# Patient Record
Sex: Male | Born: 1970 | Race: Black or African American | Hispanic: No | Marital: Married | Smoking: Never smoker
Health system: Southern US, Community
[De-identification: ages and names within clinical notes are randomized; demographics above are authoritative.]

## PROBLEM LIST (undated history)

## (undated) DIAGNOSIS — F524 Premature ejaculation: Secondary | ICD-10-CM

## (undated) DIAGNOSIS — K219 Gastro-esophageal reflux disease without esophagitis: Secondary | ICD-10-CM

## (undated) DIAGNOSIS — K638219 Small intestinal bacterial overgrowth, unspecified: Secondary | ICD-10-CM

## (undated) DIAGNOSIS — G4730 Sleep apnea, unspecified: Secondary | ICD-10-CM

## (undated) DIAGNOSIS — F32A Depression, unspecified: Secondary | ICD-10-CM

## (undated) DIAGNOSIS — E669 Obesity, unspecified: Secondary | ICD-10-CM

## (undated) DIAGNOSIS — J309 Allergic rhinitis, unspecified: Secondary | ICD-10-CM

## (undated) DIAGNOSIS — E785 Hyperlipidemia, unspecified: Secondary | ICD-10-CM

## (undated) DIAGNOSIS — K5900 Constipation, unspecified: Secondary | ICD-10-CM

## (undated) DIAGNOSIS — R0600 Dyspnea, unspecified: Secondary | ICD-10-CM

## (undated) DIAGNOSIS — I10 Essential (primary) hypertension: Secondary | ICD-10-CM

## (undated) DIAGNOSIS — F419 Anxiety disorder, unspecified: Secondary | ICD-10-CM

## (undated) DIAGNOSIS — G47419 Narcolepsy without cataplexy: Secondary | ICD-10-CM

## (undated) HISTORY — DX: Essential (primary) hypertension: I10

## (undated) HISTORY — PX: LACERATION REPAIR: SHX5168

---

## 2011-11-15 ENCOUNTER — Telehealth: Payer: Self-pay | Admitting: Internal Medicine

## 2011-11-15 NOTE — Telephone Encounter (Signed)
We can move him to next available 30 or opening.

## 2011-11-15 NOTE — Telephone Encounter (Signed)
[phone] Pt has new patient appointment 4/26 and would like to be seen sooner if possible. He is a diabetic and his blood sugar running now its 378 and he is on metform Just moved to area and no primary care

## 2011-11-16 NOTE — Telephone Encounter (Signed)
I spoke w/pt at 4:45 yesterday and advised him to go to the urgent care at that time, he agreed. Also advised him that we would put him on the wait list which he understood.

## 2011-12-08 ENCOUNTER — Ambulatory Visit (INDEPENDENT_AMBULATORY_CARE_PROVIDER_SITE_OTHER): Payer: BC Managed Care – PPO | Admitting: Internal Medicine

## 2011-12-08 ENCOUNTER — Ambulatory Visit (INDEPENDENT_AMBULATORY_CARE_PROVIDER_SITE_OTHER)
Admission: RE | Admit: 2011-12-08 | Discharge: 2011-12-08 | Disposition: A | Discharge status: Home / Self Care | Payer: BC Managed Care – PPO | Source: Ambulatory Visit | Attending: Internal Medicine | Admitting: Internal Medicine

## 2011-12-08 ENCOUNTER — Encounter: Payer: Self-pay | Admitting: Internal Medicine

## 2011-12-08 DIAGNOSIS — K5900 Constipation, unspecified: Secondary | ICD-10-CM

## 2011-12-08 DIAGNOSIS — E1165 Type 2 diabetes mellitus with hyperglycemia: Secondary | ICD-10-CM | POA: Insufficient documentation

## 2011-12-08 DIAGNOSIS — E119 Type 2 diabetes mellitus without complications: Secondary | ICD-10-CM

## 2011-12-08 DIAGNOSIS — IMO0001 Reserved for inherently not codable concepts without codable children: Secondary | ICD-10-CM | POA: Insufficient documentation

## 2011-12-08 DIAGNOSIS — Z309 Encounter for contraceptive management, unspecified: Secondary | ICD-10-CM

## 2011-12-08 DIAGNOSIS — Z23 Encounter for immunization: Secondary | ICD-10-CM

## 2011-12-08 MED ORDER — METFORMIN HCL 1000 MG PO TABS: 1000.0000 mg | ORAL_TABLET | Freq: Two times a day (BID) | ORAL | Status: DC

## 2011-12-08 MED ORDER — LUBIPROSTONE 24 MCG PO CAPS: 24.0000 ug | ORAL_CAPSULE | Freq: Two times a day (BID) | ORAL | Status: DC

## 2011-12-08 MED ORDER — GLIPIZIDE ER 2.5 MG PO TB24: 2.5000 mg | ORAL_TABLET | Freq: Every day | ORAL | Status: DC

## 2011-12-08 NOTE — Progress Notes (Signed)
Subjective:    Patient ID: Timothy Friedman, male    DOB: 21-Apr-1971, 41 y.o.   MRN: [id number]  HPI 41 year old male with history of diabetes mellitus presents to establish care. He recently moved to [facility] from [place]. He reports that he moved secondary to a new job. In regards to his diabetes, he reports good control in the past with the use of Actos and metformin. However, his previous physician took him off Actos because of concerns about side effects from this medication. He has been taking metformin 2000 mg twice daily. His sugars have been poorly controlled, and he has had blood sugars over 400. He reports that most recent blood sugar this morning was 286. He reports some dietary indiscretion but has mostly been trying to limit intake of processed carbohydrates.  He is also concerned today about chronic constipation and abdominal distention. He reports pain and distention, particularly in his left lower quadrant and is concerned about bowel obstruction. When he has a bowel movement it is typically small pebbles or liquid. He has tried over-the-counter medications to help with stools with no improvement. He denies any blood in his stools or black stools. He denies any nausea or vomiting. He has never had abdominal surgery.  Outpatient Encounter Prescriptions as of 12/08/2011  Medication Sig Dispense Refill  . metFORMIN (GLUCOPHAGE) 1000 MG tablet Take 1 tablet (1,000 mg total) by mouth 2 (two) times daily with a meal.  60 tablet  3  . glipiZIDE (GLUCOTROL XL) 2.5 MG 24 hr tablet Take 1 tablet (2.5 mg total) by mouth daily.  30 tablet  3  . lubiprostone (AMITIZA) 24 MCG capsule Take 1 capsule (24 mcg total) by mouth 2 (two) times daily with a meal.  30 capsule  0   BP 138/88  Pulse 109  Temp(Src) 98.4 F (36.9 C) (Oral)  Ht 5\' 6"  (1.676 m)  Wt 214 lb (97.07 kg)  BMI 34.54 kg/m2  SpO2 96%  Review of Systems  Constitutional: Negative for fever, chills, activity  change, appetite change, fatigue and unexpected weight change.  Eyes: Negative for visual disturbance.  Respiratory: Negative for cough and shortness of breath.   Cardiovascular: Negative for chest pain, palpitations and leg swelling.  Gastrointestinal: Positive for abdominal pain, constipation and abdominal distention.  Genitourinary: Negative for dysuria, urgency and difficulty urinating.  Musculoskeletal: Negative for arthralgias and gait problem.  Skin: Negative for color change and rash.  Hematological: Negative for adenopathy.  Psychiatric/Behavioral: Negative for sleep disturbance and dysphoric mood. The patient is not nervous/anxious.        Objective:   Physical Exam  Constitutional: He is oriented to person, place, and time. He appears well-developed and well-nourished. No distress.  HENT:  Head: Normocephalic and atraumatic.  Right Ear: External ear normal.  Left Ear: External ear normal.  Nose: Nose normal.  Mouth/Throat: Oropharynx is clear and moist. No oropharyngeal exudate.  Eyes: Conjunctivae and EOM are normal. Pupils are equal, round, and reactive to light. Right eye exhibits no discharge. Left eye exhibits no discharge. No scleral icterus.  Neck: Normal range of motion. Neck supple. No tracheal deviation present. No thyromegaly present.  Cardiovascular: Normal rate, regular rhythm and normal heart sounds.  Exam reveals no gallop and no friction rub.   No murmur heard. Pulmonary/Chest: Effort normal and breath sounds normal. No respiratory distress. He has no wheezes. He has no rales. He exhibits no tenderness.  Abdominal: Soft. Bowel sounds are normal. He exhibits distension. He  exhibits no mass. There is no tenderness. There is no guarding.  Musculoskeletal: Normal range of motion. He exhibits no edema.  Lymphadenopathy:    He has no cervical adenopathy.  Neurological: He is alert and oriented to person, place, and time. No cranial nerve deficit. Coordination  normal.  Skin: Skin is warm and dry. No rash noted. He is not diaphoretic. No erythema. No pallor.  Psychiatric: He has a normal mood and affect. His behavior is normal. Judgment and thought content normal.          Assessment & Plan:

## 2011-12-08 NOTE — Assessment & Plan Note (Signed)
Question if he may have bowel obstruction based on description of symptoms. Will get plain xray. Will start Amitiza.  If no improvement, would favor getting CT abdomen for further evaluation and GI referral for endoscopy. He will email with update. Follow up 2 weeks.

## 2011-12-08 NOTE — Assessment & Plan Note (Addendum)
Blood sugars recently have been poorly controlled. Will start glipizide. Will reduce dose of metformin to 1000 mg twice daily. Patient will check sugars twice daily and will e-mail periodically with results. Will check A1c with labs today. We discussed starting ACEi and statin, but will hold off until next visit. Followup in 2 weeks.

## 2011-12-08 NOTE — Assessment & Plan Note (Signed)
Pt would like vasectomy. Will set up referral to urology.

## 2011-12-09 LAB — LIPID PANEL
HDL: 40 mg/dL (ref >39)
LDL Cholesterol: 114 mg/dL — ABNORMAL HIGH (ref 0–99)
Total CHOL/HDL Ratio: 5.1 Ratio

## 2011-12-09 LAB — COMPREHENSIVE METABOLIC PANEL
AST: 17 U/L (ref 0–37)
BUN: 15 mg/dL (ref 6–23)
Calcium: 9.3 mg/dL (ref 8.4–10.5)
Chloride: 99 mEq/L (ref 96–112)
Creat: 1.04 mg/dL (ref 0.50–1.35)

## 2011-12-09 LAB — TSH: TSH: 0.363 u[IU]/mL (ref 0.350–4.500)

## 2011-12-09 LAB — HEMOGLOBIN A1C: Hgb A1c MFr Bld: 12.3 % — ABNORMAL HIGH (ref <5.7)

## 2011-12-10 ENCOUNTER — Encounter: Payer: Self-pay | Admitting: Internal Medicine

## 2011-12-22 ENCOUNTER — Other Ambulatory Visit: Payer: Self-pay | Admitting: *Deleted

## 2011-12-22 MED ORDER — LUBIPROSTONE 24 MCG PO CAPS: 24.0000 ug | ORAL_CAPSULE | Freq: Two times a day (BID) | ORAL | Status: AC

## 2011-12-22 NOTE — Telephone Encounter (Signed)
Rx sent to [facility], patient was advised to check with the pharmacy if he didn't hear back from [place].

## 2011-12-22 NOTE — Telephone Encounter (Signed)
Patient is requesting a Rx for Amitiza.  He stated that Dr. Dan Humphreys gave him a Rx and samples and he can't find the Rx and would like it called to [facility].  Please advise.

## 2011-12-22 NOTE — Telephone Encounter (Signed)
Fine to call in Amitiza bid, disp 60 with 3 refill

## 2012-01-01 ENCOUNTER — Ambulatory Visit: Payer: BC Managed Care – PPO | Admitting: Internal Medicine

## 2012-01-02 ENCOUNTER — Ambulatory Visit (INDEPENDENT_AMBULATORY_CARE_PROVIDER_SITE_OTHER): Payer: BC Managed Care – PPO | Admitting: Internal Medicine

## 2012-01-02 ENCOUNTER — Encounter: Payer: Self-pay | Admitting: Internal Medicine

## 2012-01-02 DIAGNOSIS — E119 Type 2 diabetes mellitus without complications: Secondary | ICD-10-CM

## 2012-01-02 DIAGNOSIS — K5900 Constipation, unspecified: Secondary | ICD-10-CM

## 2012-01-02 DIAGNOSIS — I10 Essential (primary) hypertension: Secondary | ICD-10-CM

## 2012-01-02 DIAGNOSIS — E785 Hyperlipidemia, unspecified: Secondary | ICD-10-CM

## 2012-01-02 MED ORDER — GLIPIZIDE ER 5 MG PO TB24: 5.0000 mg | ORAL_TABLET | Freq: Every day | ORAL | Status: DC

## 2012-01-02 MED ORDER — ATORVASTATIN CALCIUM 20 MG PO TABS: 20.0000 mg | ORAL_TABLET | Freq: Every day | ORAL | Status: DC

## 2012-01-02 MED ORDER — LISINOPRIL 5 MG PO TABS: 5.0000 mg | ORAL_TABLET | Freq: Every day | ORAL | Status: DC

## 2012-01-02 NOTE — Assessment & Plan Note (Signed)
BP elevated. Will add Lisinopril. Follow up BMP 1 week. RTC 1 month.

## 2012-01-02 NOTE — Progress Notes (Signed)
Subjective:    Patient ID: Timothy Friedman, male    DOB: Mar 25, 1971, 41 y.o.   MRN: [id number]  HPI 41 year old male with history of diabetes presents for followup. At his last visit, he was started on glipizide. He reports that most of his blood sugars continue to be near 180-200 fasting in year 250 post prandial. He reports full compliance with this medication. He denies any noted side effects from the medicine. He is also concerned today about some increased sweating of his feet bilaterally. He reports that this occurs when he wears diabetic socks. He would like to establish care with a podiatrist to help with trimming of his nails.  In regards to his constipation, he reports that symptoms are much improved with the use of Amitiza.  He denies any loose stools or abdominal pain.  Outpatient Encounter Prescriptions as of 01/02/2012  Medication Sig Dispense Refill  . glipiZIDE (GLUCOTROL XL) 5 MG 24 hr tablet Take 1 tablet (5 mg total) by mouth daily.  30 tablet  3  . lubiprostone (AMITIZA) 24 MCG capsule Take 1 capsule (24 mcg total) by mouth 2 (two) times daily with a meal.  60 capsule  3  . metFORMIN (GLUCOPHAGE) 1000 MG tablet Take 1 tablet (1,000 mg total) by mouth 2 (two) times daily with a meal.  60 tablet  3  . DISCONTD: glipiZIDE (GLUCOTROL XL) 2.5 MG 24 hr tablet Take 1 tablet (2.5 mg total) by mouth daily.  30 tablet  3  . atorvastatin (LIPITOR) 20 MG tablet Take 1 tablet (20 mg total) by mouth daily.  30 tablet  3  . lisinopril (PRINIVIL,ZESTRIL) 5 MG tablet Take 1 tablet (5 mg total) by mouth daily.  30 tablet  3    Review of Systems  Constitutional: Negative for fever, chills, activity change, appetite change, fatigue and unexpected weight change.  Eyes: Negative for visual disturbance.  Respiratory: Negative for cough and shortness of breath.   Cardiovascular: Negative for chest pain, palpitations and leg swelling.  Gastrointestinal: Negative for abdominal pain and abdominal  distention.  Genitourinary: Negative for dysuria, urgency and difficulty urinating.  Musculoskeletal: Negative for arthralgias and gait problem.  Skin: Negative for color change and rash.  Hematological: Negative for adenopathy.  Psychiatric/Behavioral: Negative for sleep disturbance and dysphoric mood. The patient is not nervous/anxious.    BP 144/97  Pulse 73  Temp(Src) 97.9 F (36.6 C) (Oral)  Resp 16  Wt 220 lb 8 oz (100.018 kg)  SpO2 98%     Objective:   Physical Exam  Constitutional: He is oriented to person, place, and time. He appears well-developed and well-nourished. No distress.  HENT:  Head: Normocephalic and atraumatic.  Right Ear: External ear normal.  Left Ear: External ear normal.  Nose: Nose normal.  Mouth/Throat: Oropharynx is clear and moist. No oropharyngeal exudate.  Eyes: Conjunctivae and EOM are normal. Pupils are equal, round, and reactive to light. Right eye exhibits no discharge. Left eye exhibits no discharge. No scleral icterus.  Neck: Normal range of motion. Neck supple. No tracheal deviation present. No thyromegaly present.  Cardiovascular: Normal rate, regular rhythm and normal heart sounds.  Exam reveals no gallop and no friction rub.   No murmur heard. Pulmonary/Chest: Effort normal and breath sounds normal. No respiratory distress. He has no wheezes. He has no rales. He exhibits no tenderness.  Musculoskeletal: Normal range of motion. He exhibits no edema.  Lymphadenopathy:    He has no cervical adenopathy.  Neurological: He is  alert and oriented to person, place, and time. No cranial nerve deficit. Coordination normal.  Skin: Skin is warm and dry. No rash noted. He is not diaphoretic. No erythema. No pallor.  Psychiatric: He has a normal mood and affect. His behavior is normal. Judgment and thought content normal.          Assessment & Plan:

## 2012-01-02 NOTE — Assessment & Plan Note (Signed)
Blood sugars improved but still above goal.  Discussed likely need for insulin. Pt would prefer to stick with pills for now. Will increase glipizide to 5mg  daily.  Will continue metformin. Follow up 1 month.

## 2012-01-02 NOTE — Assessment & Plan Note (Signed)
LDL 114. Discussed goal of <70. Will start lipitor. Repeat lipids and LFTs 1 month.

## 2012-01-02 NOTE — Assessment & Plan Note (Signed)
Improved with amitiza. Will continue. 

## 2012-01-09 ENCOUNTER — Other Ambulatory Visit (INDEPENDENT_AMBULATORY_CARE_PROVIDER_SITE_OTHER): Payer: BC Managed Care – PPO | Admitting: *Deleted

## 2012-01-09 DIAGNOSIS — I10 Essential (primary) hypertension: Secondary | ICD-10-CM

## 2012-01-09 LAB — BASIC METABOLIC PANEL
BUN: 14 mg/dL (ref 6–23)
Creatinine, Ser: 0.9 mg/dL (ref 0.4–1.5)
GFR: 114.53 mL/min (ref >60.00)
Glucose, Bld: 252 mg/dL — ABNORMAL HIGH (ref 70–99)
Potassium: 4.8 mEq/L (ref 3.5–5.1)

## 2012-01-27 ENCOUNTER — Encounter: Payer: Self-pay | Admitting: Internal Medicine

## 2012-01-29 ENCOUNTER — Telehealth: Payer: Self-pay | Admitting: *Deleted

## 2012-01-29 NOTE — Telephone Encounter (Signed)
Opened in error

## 2012-03-11 ENCOUNTER — Encounter: Payer: Self-pay | Admitting: Internal Medicine

## 2012-04-05 ENCOUNTER — Other Ambulatory Visit: Payer: Self-pay | Admitting: Internal Medicine

## 2012-04-05 NOTE — Telephone Encounter (Signed)
Patient needing a refill on his metformin he only has one pill left.

## 2012-04-05 NOTE — Telephone Encounter (Signed)
Patient advised via message left on cell phone voicemail, Rx was sent to pharmacy this morning.  Advised patient of our 24-48 your turn around time for all Rx refills.

## 2012-04-10 ENCOUNTER — Other Ambulatory Visit: Payer: Self-pay | Admitting: Internal Medicine

## 2012-04-12 ENCOUNTER — Ambulatory Visit (INDEPENDENT_AMBULATORY_CARE_PROVIDER_SITE_OTHER): Payer: BC Managed Care – PPO | Admitting: Internal Medicine

## 2012-04-12 ENCOUNTER — Other Ambulatory Visit: Payer: Self-pay | Admitting: Internal Medicine

## 2012-04-12 ENCOUNTER — Encounter: Payer: Self-pay | Admitting: Internal Medicine

## 2012-04-12 VITALS — BP 120/88 | HR 75 | Temp 98.3°F | Ht 66.0 in | Wt 224.0 lb

## 2012-04-12 DIAGNOSIS — E1165 Type 2 diabetes mellitus with hyperglycemia: Secondary | ICD-10-CM

## 2012-04-12 DIAGNOSIS — I10 Essential (primary) hypertension: Secondary | ICD-10-CM

## 2012-04-12 DIAGNOSIS — E785 Hyperlipidemia, unspecified: Secondary | ICD-10-CM

## 2012-04-12 LAB — COMPREHENSIVE METABOLIC PANEL
ALT: 18 U/L (ref 0–53)
AST: 14 U/L (ref 0–37)
Albumin: 4.2 g/dL (ref 3.5–5.2)
Alkaline Phosphatase: 70 U/L (ref 39–117)
BUN: 17 mg/dL (ref 6–23)
CO2: 26 mEq/L (ref 19–32)
Calcium: 9 mg/dL (ref 8.4–10.5)
Chloride: 104 mEq/L (ref 96–112)
Creatinine, Ser: 1.1 mg/dL (ref 0.4–1.5)
GFR: 94.41 mL/min (ref >60.00)
Glucose, Bld: 161 mg/dL — ABNORMAL HIGH (ref 70–99)
Potassium: 4.2 mEq/L (ref 3.5–5.1)
Sodium: 137 mEq/L (ref 135–145)
Total Bilirubin: 0.5 mg/dL (ref 0.3–1.2)
Total Protein: 7.5 g/dL (ref 6.0–8.3)

## 2012-04-12 LAB — LIPID PANEL
Cholesterol: 159 mg/dL (ref 0–200)
HDL: 40.2 mg/dL (ref >39.00)
LDL Cholesterol: 104 mg/dL — ABNORMAL HIGH (ref 0–99)
Total CHOL/HDL Ratio: 4
Triglycerides: 73 mg/dL (ref 0.0–149.0)
VLDL: 14.6 mg/dL (ref 0.0–40.0)

## 2012-04-12 LAB — HEMOGLOBIN A1C: Hgb A1c MFr Bld: 9 % — ABNORMAL HIGH (ref 4.6–6.5)

## 2012-04-12 LAB — MICROALBUMIN / CREATININE URINE RATIO
Creatinine,U: 168.8 mg/dL
Microalb, Ur: 1.4 mg/dL (ref 0.0–1.9)

## 2012-04-12 MED ORDER — GLIPIZIDE ER 5 MG PO TB24: 5.0000 mg | ORAL_TABLET | Freq: Every day | ORAL | Status: DC

## 2012-04-12 MED ORDER — GLUCOSE BLOOD VI STRP: ORAL_STRIP | Status: DC

## 2012-04-12 NOTE — Progress Notes (Signed)
  Subjective:    Patient ID: Timothy Friedman, male    DOB: 02-Jun-1971, 41 y.o.   MRN: [id number]  HPI 41 year old male with history of diabetes, hyperlipidemia, hypertension presents for followup. He reports he has been doing very well he has made significant effort in improving his diet and has been taking medications regularly. He notes that blood sugars have been typically in the 110-120 range fasting. He denies any low sugar less than 60 or elevated sugars greater than 200. He denies any side effects from his medications. He denies any chest pain, palpitations, headache. He reports normal energy level.  Outpatient Encounter Prescriptions as of 04/12/2012  Medication Sig Dispense Refill  . glipiZIDE (GLUCOTROL XL) 5 MG 24 hr tablet Take 1 tablet (5 mg total) by mouth daily.  90 tablet  3  . metFORMIN (GLUCOPHAGE) 1000 MG tablet TAKE 1 TABLET BY MOUTH TWICE DAILY WITH A MEAL  60 tablet  3  . atorvastatin (LIPITOR) 20 MG tablet Take 1 tablet (20 mg total) by mouth daily.  30 tablet  3  . lisinopril (PRINIVIL,ZESTRIL) 5 MG tablet Take 1 tablet (5 mg total) by mouth daily.  30 tablet  3   BP 120/88  Pulse 75  Temp 98.3 F (36.8 C) (Oral)  Ht 5\' 6"  (1.676 m)  Wt 224 lb (101.606 kg)  BMI 36.15 kg/m2  SpO2 97%  Review of Systems  Constitutional: Negative for fever, chills, activity change, appetite change, fatigue and unexpected weight change.  Eyes: Negative for visual disturbance.  Respiratory: Negative for cough and shortness of breath.   Cardiovascular: Negative for chest pain, palpitations and leg swelling.  Gastrointestinal: Negative for abdominal pain and abdominal distention.  Genitourinary: Negative for dysuria, urgency and difficulty urinating.  Musculoskeletal: Negative for arthralgias and gait problem.  Skin: Negative for color change and rash.  Hematological: Negative for adenopathy.  Psychiatric/Behavioral: Negative for disturbed wake/sleep cycle and dysphoric mood. The patient  is not nervous/anxious.        Objective:   Physical Exam  Constitutional: He is oriented to person, place, and time. He appears well-developed and well-nourished. No distress.  HENT:  Head: Normocephalic and atraumatic.  Right Ear: External ear normal.  Left Ear: External ear normal.  Nose: Nose normal.  Mouth/Throat: Oropharynx is clear and moist. No oropharyngeal exudate.  Eyes: Conjunctivae and EOM are normal. Pupils are equal, round, and reactive to light. Right eye exhibits no discharge. Left eye exhibits no discharge. No scleral icterus.  Neck: Normal range of motion. Neck supple. No tracheal deviation present. No thyromegaly present.  Cardiovascular: Normal rate, regular rhythm and normal heart sounds.  Exam reveals no gallop and no friction rub.   No murmur heard. Pulmonary/Chest: Effort normal and breath sounds normal. No respiratory distress. He has no wheezes. He has no rales. He exhibits no tenderness.  Musculoskeletal: Normal range of motion. He exhibits no edema.  Lymphadenopathy:    He has no cervical adenopathy.  Neurological: He is alert and oriented to person, place, and time. No cranial nerve deficit. Coordination normal.  Skin: Skin is warm and dry. No rash noted. He is not diaphoretic. No erythema. No pallor.  Psychiatric: He has a normal mood and affect. His behavior is normal. Judgment and thought content normal.          Assessment & Plan:

## 2012-04-12 NOTE — Telephone Encounter (Signed)
Rx for test strips sent to patients pharmacy, left message on machine at home advising patient as instructed.

## 2012-04-12 NOTE — Assessment & Plan Note (Signed)
Blood pressure well-controlled on lisinopril. We'll continue. Will check renal function with labs today. Followup 3 months.

## 2012-04-12 NOTE — Assessment & Plan Note (Signed)
Lipids noted to be elevated on previous labs. Will recheck lipid profile today. Goal LDL less than 70. Continue atorvastatin.

## 2012-04-12 NOTE — Assessment & Plan Note (Signed)
Patient reports excellent control of blood sugars. Will repeat A1c with labs today. Followup 3 months. Continue current medications.

## 2012-04-12 NOTE — Telephone Encounter (Signed)
Patient forgot to ask for test stips for [facility] meter [facility] [place].

## 2012-04-25 ENCOUNTER — Other Ambulatory Visit: Payer: Self-pay | Admitting: Internal Medicine

## 2012-04-25 MED ORDER — METFORMIN HCL 1000 MG PO TABS: 1000.0000 mg | ORAL_TABLET | Freq: Two times a day (BID) | ORAL | Status: DC

## 2012-04-25 NOTE — Telephone Encounter (Signed)
Spoke with patient via telephone, Rx for Metformin was called in for 30 day supply instead of 90 days.  I spoke with pharmacist and they stated that patient has already picked up Rx in 03/2012 but I could send in another refill for 90 days and they will keep it on file until he needs another refill.  Patient is ok with this.

## 2012-04-25 NOTE — Telephone Encounter (Signed)
Patient's medication was called in for 30 days and not 90 days as he requested.

## 2012-05-06 ENCOUNTER — Telehealth: Payer: Self-pay | Admitting: Internal Medicine

## 2012-05-06 NOTE — Telephone Encounter (Signed)
Patient is needing a note that he is under a doctors care and the dates he was seen in the office.

## 2012-05-06 NOTE — Telephone Encounter (Signed)
Fine to write a note saying he has been seen here.

## 2012-05-07 NOTE — Telephone Encounter (Signed)
Left message on cell phone voicemail advising patient that letter is ready for pick up will be left at front desk. 

## 2012-05-31 ENCOUNTER — Telehealth: Payer: Self-pay | Admitting: Internal Medicine

## 2012-05-31 NOTE — Telephone Encounter (Signed)
He needs an appointment to follow up, as DM has been out of control with last A1c of 9%.

## 2012-05-31 NOTE — Telephone Encounter (Signed)
PT CALLED HE STATED HE NEEDS NOTE SAYING THAT HIS DIABETES IS UNDER CONTROL WITH MEDS AND PT SHOULD BE ABLE TO FUNCTION AND DO NORMAL ACTIVIES FOR SCHOOL PLEASE ADVISE WHEN READY

## 2012-05-31 NOTE — Telephone Encounter (Signed)
Left message on cell phone voicemail advising patient as instructed, advised him to call back and schedule f/u appt.

## 2012-06-04 ENCOUNTER — Ambulatory Visit (INDEPENDENT_AMBULATORY_CARE_PROVIDER_SITE_OTHER): Payer: BC Managed Care – PPO | Admitting: Internal Medicine

## 2012-06-04 ENCOUNTER — Encounter: Payer: Self-pay | Admitting: Internal Medicine

## 2012-06-04 ENCOUNTER — Telehealth: Payer: Self-pay | Admitting: Internal Medicine

## 2012-06-04 VITALS — BP 122/82 | HR 75 | Temp 97.8°F | Ht 66.0 in | Wt 219.0 lb

## 2012-06-04 DIAGNOSIS — E119 Type 2 diabetes mellitus without complications: Secondary | ICD-10-CM

## 2012-06-04 DIAGNOSIS — K5900 Constipation, unspecified: Secondary | ICD-10-CM

## 2012-06-04 LAB — COMPREHENSIVE METABOLIC PANEL
AST: 15 U/L (ref 0–37)
Albumin: 3.9 g/dL (ref 3.5–5.2)
Alkaline Phosphatase: 59 U/L (ref 39–117)
Potassium: 4.4 mEq/L (ref 3.5–5.1)
Sodium: 137 mEq/L (ref 135–145)
Total Bilirubin: 0.6 mg/dL (ref 0.3–1.2)
Total Protein: 7.1 g/dL (ref 6.0–8.3)

## 2012-06-04 LAB — HEMOGLOBIN A1C: Hgb A1c MFr Bld: 8.8 % — ABNORMAL HIGH (ref 4.6–6.5)

## 2012-06-04 MED ORDER — LUBIPROSTONE 24 MCG PO CAPS: 24.0000 ug | ORAL_CAPSULE | Freq: Two times a day (BID) | ORAL | Status: DC

## 2012-06-04 MED ORDER — GLUCOSE BLOOD VI STRP: ORAL_STRIP | Status: DC

## 2012-06-04 NOTE — Assessment & Plan Note (Signed)
Patient reports better control of his blood sugars on current medications. Will recheck A1c with labs today. Followup in 3 months.

## 2012-06-04 NOTE — Progress Notes (Signed)
Subjective:    Patient ID: Timothy Friedman, male    DOB: Jan 30, 1971, 41 y.o.   MRN: [id number]  HPI 41 year old male with history of diabetes, hypertension, and constipation presents for followup. In regards to diabetes, he reports that blood sugars have been well-controlled, typically near 100 fasting. He has rarely had a sugars near 200 but this is unusual for him. He reports full compliance with his medications. In regards to constipation, he reports significant improvement in symptoms with use of Amitiza. He denies any side effects from this medication.  Outpatient Encounter Prescriptions as of 06/04/2012  Medication Sig Dispense Refill  . glipiZIDE (GLUCOTROL XL) 5 MG 24 hr tablet Take 1 tablet (5 mg total) by mouth daily.  90 tablet  3  . glucose blood test strip To use with meter of patients choice, use to check blood sugar 2-3 times daily.  100 each  12  . lisinopril (PRINIVIL,ZESTRIL) 5 MG tablet Take 1 tablet (5 mg total) by mouth daily.  30 tablet  3  . metFORMIN (GLUCOPHAGE) 1000 MG tablet Take 1 tablet (1,000 mg total) by mouth 2 (two) times daily with a meal.  180 tablet  3  . lubiprostone (AMITIZA) 24 MCG capsule Take 1 capsule (24 mcg total) by mouth 2 (two) times daily with a meal.  30 capsule  6   BP 122/82  Pulse 75  Temp 97.8 F (36.6 C) (Oral)  Ht 5\' 6"  (1.676 m)  Wt 219 lb (99.338 kg)  BMI 35.35 kg/m2  SpO2 98%  Review of Systems  Constitutional: Negative for fever, chills, activity change, appetite change, fatigue and unexpected weight change.  Eyes: Negative for visual disturbance.  Respiratory: Negative for cough and shortness of breath.   Cardiovascular: Negative for chest pain, palpitations and leg swelling.  Gastrointestinal: Positive for constipation. Negative for abdominal pain and abdominal distention.  Genitourinary: Negative for dysuria, urgency and difficulty urinating.  Musculoskeletal: Negative for arthralgias and gait problem.  Skin: Negative for  color change and rash.  Hematological: Negative for adenopathy.  Psychiatric/Behavioral: Negative for disturbed wake/sleep cycle and dysphoric mood. The patient is not nervous/anxious.        Objective:   Physical Exam  Constitutional: He is oriented to person, place, and time. He appears well-developed and well-nourished. No distress.  HENT:  Head: Normocephalic and atraumatic.  Right Ear: External ear normal.  Left Ear: External ear normal.  Nose: Nose normal.  Mouth/Throat: Oropharynx is clear and moist. No oropharyngeal exudate.  Eyes: Conjunctivae normal and EOM are normal. Pupils are equal, round, and reactive to light. Right eye exhibits no discharge. Left eye exhibits no discharge. No scleral icterus.  Neck: Normal range of motion. Neck supple. No tracheal deviation present. No thyromegaly present.  Cardiovascular: Normal rate, regular rhythm and normal heart sounds.  Exam reveals no gallop and no friction rub.   No murmur heard. Pulmonary/Chest: Effort normal and breath sounds normal. No respiratory distress. He has no wheezes. He has no rales. He exhibits no tenderness.  Musculoskeletal: Normal range of motion. He exhibits no edema.  Lymphadenopathy:    He has no cervical adenopathy.  Neurological: He is alert and oriented to person, place, and time. No cranial nerve deficit. Coordination normal.  Skin: Skin is warm and dry. No rash noted. He is not diaphoretic. No erythema. No pallor.  Psychiatric: He has a normal mood and affect. His behavior is normal. Judgment and thought content normal.  Assessment & Plan:

## 2012-06-04 NOTE — Telephone Encounter (Signed)
Question to pt through MyChart

## 2012-06-04 NOTE — Assessment & Plan Note (Signed)
Patient reports improvement in symptoms of constipation with use of amitiza. Will continue.

## 2012-07-30 ENCOUNTER — Telehealth: Payer: Self-pay | Admitting: Internal Medicine

## 2012-07-30 NOTE — Telephone Encounter (Signed)
Wants samples of Metformin and Glipizide

## 2012-07-31 NOTE — Telephone Encounter (Signed)
We do not have samples of either medication. Tried to call pt but there was no voicemail. Will call back later.

## 2012-09-29 ENCOUNTER — Other Ambulatory Visit: Payer: Self-pay

## 2012-12-11 ENCOUNTER — Encounter: Payer: Self-pay | Admitting: Emergency Medicine

## 2012-12-11 ENCOUNTER — Ambulatory Visit (INDEPENDENT_AMBULATORY_CARE_PROVIDER_SITE_OTHER): Payer: BC Managed Care – PPO | Admitting: Internal Medicine

## 2012-12-11 ENCOUNTER — Encounter: Payer: Self-pay | Admitting: Internal Medicine

## 2012-12-11 VITALS — BP 130/98 | HR 72 | Temp 98.4°F | Wt 212.0 lb

## 2012-12-11 DIAGNOSIS — K5909 Other constipation: Secondary | ICD-10-CM

## 2012-12-11 DIAGNOSIS — IMO0001 Reserved for inherently not codable concepts without codable children: Secondary | ICD-10-CM

## 2012-12-11 DIAGNOSIS — E1165 Type 2 diabetes mellitus with hyperglycemia: Secondary | ICD-10-CM | POA: Insufficient documentation

## 2012-12-11 DIAGNOSIS — K5900 Constipation, unspecified: Secondary | ICD-10-CM

## 2012-12-11 DIAGNOSIS — I10 Essential (primary) hypertension: Secondary | ICD-10-CM

## 2012-12-11 LAB — COMPREHENSIVE METABOLIC PANEL
ALT: 634 U/L — ABNORMAL HIGH (ref 0–53)
CO2: 30 mEq/L (ref 19–32)
Calcium: 9.1 mg/dL (ref 8.4–10.5)
Chloride: 99 mEq/L (ref 96–112)
Creatinine, Ser: 0.9 mg/dL (ref 0.4–1.5)
GFR: 121.42 mL/min (ref >60.00)
Glucose, Bld: 261 mg/dL — ABNORMAL HIGH (ref 70–99)
Sodium: 134 mEq/L — ABNORMAL LOW (ref 135–145)
Total Bilirubin: 0.7 mg/dL (ref 0.3–1.2)
Total Protein: 7.5 g/dL (ref 6.0–8.3)

## 2012-12-11 LAB — HEMOGLOBIN A1C: Hgb A1c MFr Bld: 11.6 % — ABNORMAL HIGH (ref 4.6–6.5)

## 2012-12-11 LAB — MICROALBUMIN / CREATININE URINE RATIO: Microalb, Ur: 2.1 mg/dL — ABNORMAL HIGH (ref 0.0–1.9)

## 2012-12-11 LAB — LIPID PANEL
LDL Cholesterol: 94 mg/dL (ref 0–99)
Total CHOL/HDL Ratio: 4

## 2012-12-11 MED ORDER — LISINOPRIL 5 MG PO TABS: 5.0000 mg | ORAL_TABLET | Freq: Every day | ORAL | Status: DC

## 2012-12-11 MED ORDER — GLIPIZIDE ER 5 MG PO TB24: 5.0000 mg | ORAL_TABLET | Freq: Every day | ORAL | Status: DC

## 2012-12-11 MED ORDER — METFORMIN HCL 1000 MG PO TABS: 1000.0000 mg | ORAL_TABLET | Freq: Two times a day (BID) | ORAL | Status: DC

## 2012-12-11 MED ORDER — LINACLOTIDE 145 MCG PO CAPS: 145.0000 ug | ORAL_CAPSULE | Freq: Every day | ORAL | Status: AC

## 2012-12-11 NOTE — Assessment & Plan Note (Signed)
Will restart Metformin and Glipizide. Check A1c with labs. Follow up 3 months and prn.

## 2012-12-11 NOTE — Progress Notes (Signed)
Subjective:    Patient ID: Timothy Friedman, male    DOB: 09-03-1970, 42 y.o.   MRN: [id number]  HPI 42YO male with h/o DM, HTN, chronic constipation presents for follow up. Recently out of medications as unable to afford, lost insurance coverage.  DM - has been off medication x several months, occasionally taking Metformin. BG typically less than 170.  HTN - Has not been taking lisinopril. BP elevated per report, but did not bring record. Denies CP, palpitations, headache.  Constipation - Improved somewhat with amitiza, however recently out of this. Reports chronic straining, infrequent stools. Notes that abdomen feels distended after eating. No blood in stool or black stool. No nausea, vomiting. No abdominal pain. Had colonoscopy in the past to evaluate this, which he reports was normal, however notes several years ago. Chronic constipation has significant impact on quality of life.  Outpatient Encounter Prescriptions as of 12/11/2012  Medication Sig Dispense Refill  . glucose blood test strip To use with meter of patients choice, use to check blood sugar 2-3 times daily.  100 each  12  . metFORMIN (GLUCOPHAGE) 1000 MG tablet Take 1 tablet (1,000 mg total) by mouth 2 (two) times daily with a meal.  180 tablet  3  . [DISCONTINUED] metFORMIN (GLUCOPHAGE) 1000 MG tablet Take 1 tablet (1,000 mg total) by mouth 2 (two) times daily with a meal.  180 tablet  3  . glipiZIDE (GLUCOTROL XL) 5 MG 24 hr tablet Take 1 tablet (5 mg total) by mouth daily.  90 tablet  3  . Linaclotide (LINZESS) 145 MCG CAPS Take 1 capsule (145 mcg total) by mouth daily.  30 capsule  3  . lisinopril (PRINIVIL,ZESTRIL) 5 MG tablet Take 1 tablet (5 mg total) by mouth daily.  90 tablet  4   No facility-administered encounter medications on file as of 12/11/2012.   BP 130/98  Pulse 72  Temp(Src) 98.4 F (36.9 C) (Oral)  Wt 212 lb (96.163 kg)  BMI 34.23 kg/m2  SpO2 99%  Review of Systems  Constitutional: Negative for fever,  chills, activity change, appetite change, fatigue and unexpected weight change.  Eyes: Negative for visual disturbance.  Respiratory: Negative for cough and shortness of breath.   Cardiovascular: Negative for chest pain, palpitations and leg swelling.  Gastrointestinal: Positive for constipation and abdominal distention. Negative for nausea, vomiting, abdominal pain, diarrhea, blood in stool, anal bleeding and rectal pain.  Genitourinary: Negative for dysuria, urgency and difficulty urinating.  Musculoskeletal: Negative for arthralgias and gait problem.  Skin: Negative for color change and rash.  Hematological: Negative for adenopathy.  Psychiatric/Behavioral: Negative for sleep disturbance and dysphoric mood. The patient is not nervous/anxious.        Objective:   Physical Exam  Constitutional: He is oriented to person, place, and time. He appears well-developed and well-nourished. No distress.  HENT:  Head: Normocephalic and atraumatic.  Right Ear: External ear normal.  Left Ear: External ear normal.  Nose: Nose normal.  Mouth/Throat: Oropharynx is clear and moist. No oropharyngeal exudate.  Eyes: Conjunctivae and EOM are normal. Pupils are equal, round, and reactive to light. Right eye exhibits no discharge. Left eye exhibits no discharge. No scleral icterus.  Neck: Normal range of motion. Neck supple. No tracheal deviation present. No thyromegaly present.  Cardiovascular: Normal rate, regular rhythm and normal heart sounds.  Exam reveals no gallop and no friction rub.   No murmur heard. Pulmonary/Chest: Effort normal and breath sounds normal. No respiratory distress. He has no  wheezes. He has no rales. He exhibits no tenderness.  Abdominal: Soft. Bowel sounds are normal. He exhibits no distension. There is no tenderness. There is no rebound.  Musculoskeletal: Normal range of motion. He exhibits no edema.  Lymphadenopathy:    He has no cervical adenopathy.  Neurological: He is alert  and oriented to person, place, and time. No cranial nerve deficit. Coordination normal.  Skin: Skin is warm and dry. No rash noted. He is not diaphoretic. No erythema. No pallor.  Psychiatric: He has a normal mood and affect. His behavior is normal. Judgment and thought content normal.          Assessment & Plan:

## 2012-12-11 NOTE — Assessment & Plan Note (Addendum)
Chronic constipation most consistent with IBS. Reports colonoscopy normal in the past, however I do not have these records. Minimal improvement with Amitiza. Will try using Linzess. Will set up GI evaluation. Question if repeat colonoscopy might be helpful.

## 2012-12-11 NOTE — Assessment & Plan Note (Signed)
BP well controlled today, however elevated at home. Will restart Lisinopril. Will check renal function today and repeat K in 1 week.

## 2012-12-12 ENCOUNTER — Telehealth: Payer: Self-pay | Admitting: *Deleted

## 2012-12-12 NOTE — Telephone Encounter (Signed)
Pt is coming for labs tomorrow 05.02.2014, what labs and dx?  Thank you

## 2012-12-13 ENCOUNTER — Other Ambulatory Visit: Payer: BC Managed Care – PPO

## 2012-12-16 ENCOUNTER — Other Ambulatory Visit (INDEPENDENT_AMBULATORY_CARE_PROVIDER_SITE_OTHER): Payer: BC Managed Care – PPO

## 2012-12-16 DIAGNOSIS — R7989 Other specified abnormal findings of blood chemistry: Secondary | ICD-10-CM

## 2012-12-17 LAB — HEPATITIS B CORE ANTIBODY, IGM: Hep B C IgM: NEGATIVE

## 2012-12-17 LAB — COMPREHENSIVE METABOLIC PANEL
Albumin: 4 g/dL (ref 3.5–5.2)
Alkaline Phosphatase: 91 U/L (ref 39–117)
BUN: 13 mg/dL (ref 6–23)
CO2: 24 mEq/L (ref 19–32)
Calcium: 9 mg/dL (ref 8.4–10.5)
GFR: 102.57 mL/min (ref >60.00)
Glucose, Bld: 256 mg/dL — ABNORMAL HIGH (ref 70–99)
Potassium: 4.3 mEq/L (ref 3.5–5.1)
Sodium: 134 mEq/L — ABNORMAL LOW (ref 135–145)
Total Protein: 7.2 g/dL (ref 6.0–8.3)

## 2012-12-17 LAB — CBC WITH DIFFERENTIAL/PLATELET
Basophils Relative: 1.6 % (ref 0.0–3.0)
Eosinophils Relative: 1.5 % (ref 0.0–5.0)
Lymphocytes Relative: 40.2 % (ref 12.0–46.0)
Monocytes Absolute: 0.2 10*3/uL (ref 0.1–1.0)
Monocytes Relative: 4.7 % (ref 3.0–12.0)
Neutrophils Relative %: 52 % (ref 43.0–77.0)
Platelets: 289 10*3/uL (ref 150.0–400.0)
RBC: 4.66 Mil/uL (ref 4.22–5.81)
WBC: 4.9 10*3/uL (ref 4.5–10.5)

## 2012-12-17 LAB — CERULOPLASMIN: Ceruloplasmin: 25 mg/dL (ref 20–60)

## 2012-12-17 LAB — HEPATITIS C ANTIBODY: HCV Ab: NEGATIVE

## 2012-12-17 LAB — PROTIME-INR
INR: 1.1 ratio — ABNORMAL HIGH (ref 0.8–1.0)
Prothrombin Time: 11.5 s (ref 10.2–12.4)

## 2012-12-17 LAB — SEDIMENTATION RATE: Sed Rate: 8 mm/hr (ref 0–22)

## 2012-12-17 LAB — HEPATITIS B SURFACE ANTIGEN: Hepatitis B Surface Ag: NEGATIVE

## 2012-12-17 LAB — ANA: Anti Nuclear Antibody(ANA): NEGATIVE

## 2012-12-17 LAB — HEPATITIS B SURFACE ANTIBODY,QUALITATIVE: Hep B S Ab: NONREACTIVE

## 2012-12-20 LAB — MITOCHONDRIAL ANTIBODIES: Mitochondrial M2 Ab, IgG: 0.86 (ref <0.91)

## 2012-12-24 ENCOUNTER — Encounter: Payer: Self-pay | Admitting: *Deleted

## 2012-12-27 ENCOUNTER — Encounter: Payer: Self-pay | Admitting: Internal Medicine

## 2012-12-31 ENCOUNTER — Ambulatory Visit: Payer: BC Managed Care – PPO | Admitting: Internal Medicine

## 2013-01-29 ENCOUNTER — Encounter: Payer: Self-pay | Admitting: Internal Medicine

## 2013-01-29 ENCOUNTER — Ambulatory Visit (INDEPENDENT_AMBULATORY_CARE_PROVIDER_SITE_OTHER): Payer: BC Managed Care – PPO | Admitting: Internal Medicine

## 2013-01-29 VITALS — BP 110/80 | HR 89 | Temp 98.1°F | Wt 215.0 lb

## 2013-01-29 DIAGNOSIS — K5909 Other constipation: Secondary | ICD-10-CM

## 2013-01-29 DIAGNOSIS — I10 Essential (primary) hypertension: Secondary | ICD-10-CM

## 2013-01-29 DIAGNOSIS — R7989 Other specified abnormal findings of blood chemistry: Secondary | ICD-10-CM

## 2013-01-29 DIAGNOSIS — E1165 Type 2 diabetes mellitus with hyperglycemia: Secondary | ICD-10-CM

## 2013-01-29 DIAGNOSIS — K5900 Constipation, unspecified: Secondary | ICD-10-CM

## 2013-01-29 LAB — COMPREHENSIVE METABOLIC PANEL
AST: 20 U/L (ref 0–37)
Alkaline Phosphatase: 64 U/L (ref 39–117)
Glucose, Bld: 135 mg/dL — ABNORMAL HIGH (ref 70–99)
Sodium: 134 mEq/L — ABNORMAL LOW (ref 135–145)
Total Bilirubin: 1 mg/dL (ref 0.3–1.2)
Total Protein: 7.8 g/dL (ref 6.0–8.3)

## 2013-01-29 MED ORDER — GLUCOSE BLOOD VI STRP: ORAL_STRIP | Status: DC

## 2013-01-29 NOTE — Assessment & Plan Note (Signed)
Symptoms well controlled on Linzess. Will continue. 

## 2013-01-29 NOTE — Progress Notes (Signed)
Subjective:    Patient ID: Timothy Friedman, male    DOB: 1971/05/30, 42 y.o.   MRN: [id number]  HPI 42 year old male with history of diabetes, hypertension, and chronic constipation presents for followup of recent elevation of liver function tests. Liver function tests were found to be markedly elevated on check in May 2014. Subsequent evaluation including testing for viral and autoimmune hepatitis was negative. GI evaluation was scheduled but patient canceled this appointment. He has rescheduled his appointment for June 2014. He denies use of any new medications or supplements. No recent use of Tylenol. He does not consume alcohol or illicit drugs. He has no h/o viral hepatitis.   He reports he is generally feeling well. He has not been checking his blood sugars because he ran out of his test strips. He reports that he has been compliant with medication. He reports symptoms of chronic constipation has been much improved with use of Linzess. No new concerns today.  Outpatient Encounter Prescriptions as of 01/29/2013  Medication Sig Dispense Refill  . glipiZIDE (GLUCOTROL XL) 5 MG 24 hr tablet Take 1 tablet (5 mg total) by mouth daily.  90 tablet  3  . glucose blood test strip To use with meter of patients choice, use to check blood sugar 2-3 times daily.  100 each  12  . Linaclotide (LINZESS) 145 MCG CAPS Take 1 capsule (145 mcg total) by mouth daily.  30 capsule  3  . lisinopril (PRINIVIL,ZESTRIL) 5 MG tablet Take 1 tablet (5 mg total) by mouth daily.  90 tablet  4  . metFORMIN (GLUCOPHAGE) 1000 MG tablet Take 1 tablet (1,000 mg total) by mouth 2 (two) times daily with a meal.  180 tablet  3  . [DISCONTINUED] glucose blood test strip To use with meter of patients choice, use to check blood sugar 2-3 times daily.  100 each  12   No facility-administered encounter medications on file as of 01/29/2013.   BP 110/80  Pulse 89  Temp(Src) 98.1 F (36.7 C) (Oral)  Wt 215 lb (97.523 kg)  BMI 34.72  kg/m2  SpO2 97%  Review of Systems  Constitutional: Negative for fever, chills, activity change, appetite change, fatigue and unexpected weight change.  Eyes: Negative for visual disturbance.  Respiratory: Negative for cough and shortness of breath.   Cardiovascular: Negative for chest pain, palpitations and leg swelling.  Gastrointestinal: Negative for abdominal pain and abdominal distention.  Genitourinary: Negative for dysuria, urgency and difficulty urinating.  Musculoskeletal: Negative for arthralgias and gait problem.  Skin: Negative for color change and rash.  Hematological: Negative for adenopathy.  Psychiatric/Behavioral: Negative for sleep disturbance and dysphoric mood. The patient is not nervous/anxious.        Objective:   Physical Exam  Constitutional: He is oriented to person, place, and time. He appears well-developed and well-nourished. No distress.  HENT:  Head: Normocephalic and atraumatic.  Right Ear: External ear normal.  Left Ear: External ear normal.  Nose: Nose normal.  Mouth/Throat: Oropharynx is clear and moist. No oropharyngeal exudate.  Eyes: Conjunctivae and EOM are normal. Pupils are equal, round, and reactive to light. Right eye exhibits no discharge. Left eye exhibits no discharge. No scleral icterus.  Neck: Normal range of motion. Neck supple. No tracheal deviation present. No thyromegaly present.  Cardiovascular: Normal rate, regular rhythm and normal heart sounds.  Exam reveals no gallop and no friction rub.   No murmur heard. Pulmonary/Chest: Effort normal and breath sounds normal. No respiratory distress. He has  no wheezes. He has no rales. He exhibits no tenderness.  Abdominal: Soft. Bowel sounds are normal. He exhibits no distension and no mass. There is no hepatosplenomegaly. There is no tenderness. There is no rebound.  Musculoskeletal: Normal range of motion. He exhibits no edema.  Lymphadenopathy:    He has no cervical adenopathy.   Neurological: He is alert and oriented to person, place, and time. No cranial nerve deficit. Coordination normal.  Skin: Skin is warm and dry. No rash noted. He is not diaphoretic. No erythema. No pallor.  Psychiatric: He has a normal mood and affect. His behavior is normal. Judgment and thought content normal.          Assessment & Plan:

## 2013-01-29 NOTE — Assessment & Plan Note (Signed)
Pt has not been checking BG. Encouraged compliance with checking BG at least a few times per week. Refilled test strips. Continue current meds. Repeat A1c in July.

## 2013-01-29 NOTE — Assessment & Plan Note (Signed)
Pt canceled evaluation with GI. Encouraged him to keep 02/04/2013 appointment. Will repeat LFTs today. Reviewed evaluation to date including testing for viral hepatitis and autoimmune hepatitis which was negative.

## 2013-01-29 NOTE — Assessment & Plan Note (Signed)
BP Readings from Last 3 Encounters:  01/29/13 110/80  12/11/12 130/98  06/04/12 122/82   BP well controlled on Lisinopril. Will continue.

## 2013-02-03 ENCOUNTER — Encounter: Payer: Self-pay | Admitting: Internal Medicine

## 2013-02-04 ENCOUNTER — Ambulatory Visit: Payer: BC Managed Care – PPO | Admitting: Internal Medicine

## 2013-02-25 ENCOUNTER — Encounter: Payer: Self-pay | Admitting: Internal Medicine

## 2013-03-18 ENCOUNTER — Encounter: Payer: Self-pay | Admitting: Internal Medicine

## 2013-03-28 ENCOUNTER — Other Ambulatory Visit: Payer: Self-pay | Admitting: Internal Medicine

## 2013-03-28 ENCOUNTER — Ambulatory Visit: Payer: BC Managed Care – PPO | Admitting: Internal Medicine

## 2013-03-28 DIAGNOSIS — G4733 Obstructive sleep apnea (adult) (pediatric): Secondary | ICD-10-CM

## 2013-04-16 ENCOUNTER — Ambulatory Visit: Payer: BC Managed Care – PPO | Admitting: Internal Medicine

## 2013-04-28 ENCOUNTER — Encounter: Payer: Self-pay | Admitting: Internal Medicine

## 2013-06-19 ENCOUNTER — Other Ambulatory Visit: Payer: Self-pay

## 2013-07-15 ENCOUNTER — Other Ambulatory Visit: Payer: Self-pay | Admitting: Internal Medicine

## 2013-08-22 ENCOUNTER — Ambulatory Visit: Payer: BC Managed Care – PPO

## 2013-08-22 ENCOUNTER — Encounter: Payer: Self-pay | Admitting: Internal Medicine

## 2013-08-22 ENCOUNTER — Ambulatory Visit (INDEPENDENT_AMBULATORY_CARE_PROVIDER_SITE_OTHER): Payer: BC Managed Care – PPO | Admitting: Internal Medicine

## 2013-08-22 VITALS — BP 128/80 | HR 90 | Temp 98.2°F | Wt 208.0 lb

## 2013-08-22 DIAGNOSIS — R6889 Other general symptoms and signs: Secondary | ICD-10-CM

## 2013-08-22 DIAGNOSIS — J209 Acute bronchitis, unspecified: Secondary | ICD-10-CM

## 2013-08-22 DIAGNOSIS — H539 Unspecified visual disturbance: Secondary | ICD-10-CM

## 2013-08-22 LAB — POCT INFLUENZA A/B
INFLUENZA A, POC: NEGATIVE
Influenza B, POC: NEGATIVE

## 2013-08-22 MED ORDER — AZITHROMYCIN 250 MG PO TABS: ORAL_TABLET | ORAL | Status: DC

## 2013-08-22 MED ORDER — BENZONATATE 200 MG PO CAPS: 200.0000 mg | ORAL_CAPSULE | Freq: Two times a day (BID) | ORAL | Status: DC | PRN

## 2013-08-22 NOTE — Progress Notes (Signed)
Subjective:    Patient ID: Timothy Friedman, male    DOB: [birth year], 43 y.o.   MRN: [id number]  HPI  43YO male presents for acute visit. Complains of cough x about 1-2 weeks. Occasionally productive of yellow mucous. No fever, or chills.  Occasional myalgia. No dyspnea, chest pain. Taking OTC cough medication and mucolytic with no improvement.  He also notes recent decreased vision and blurriness, particularly at night. He has not recently had eye exam and would like to schedule this.  Outpatient Prescriptions Prior to Visit  Medication Sig Dispense Refill  . glipiZIDE (GLUCOTROL XL) 5 MG 24 hr tablet Take 1 tablet (5 mg total) by mouth daily.  90 tablet  3  . glucose blood test strip To use with meter of patients choice, use to check blood sugar 2-3 times daily.  100 each  12  . lisinopril (PRINIVIL,ZESTRIL) 5 MG tablet Take 1 tablet (5 mg total) by mouth daily.  90 tablet  4  . metFORMIN (GLUCOPHAGE) 1000 MG tablet TAKE 1 TABLET TWICE DAILY WITH MEALS  60 tablet  0  . Linaclotide (LINZESS) 145 MCG CAPS Take 1 capsule (145 mcg total) by mouth daily.  30 capsule  3   No facility-administered medications prior to visit.   BP 128/80  Pulse 90  Temp(Src) 98.2 F (36.8 C) (Oral)  Wt 208 lb (94.348 kg)  SpO2 98%   Review of Systems  Constitutional: Positive for fatigue. Negative for fever, chills and activity change.  HENT: Positive for congestion, postnasal drip and rhinorrhea. Negative for ear discharge, ear pain, hearing loss, nosebleeds, sinus pressure, sneezing, sore throat, tinnitus, trouble swallowing and voice change.   Eyes: Negative for discharge, redness, itching and visual disturbance.  Respiratory: Positive for cough. Negative for chest tightness, shortness of breath, wheezing and stridor.   Cardiovascular: Negative for chest pain and leg swelling.  Musculoskeletal: Negative for arthralgias, myalgias, neck pain and neck stiffness.  Skin: Negative for color change and rash.   Neurological: Negative for dizziness, facial asymmetry and headaches.  Psychiatric/Behavioral: Negative for sleep disturbance.       Objective:   Physical Exam  Constitutional: He is oriented to person, place, and time. He appears well-developed and well-nourished. No distress.  HENT:  Head: Normocephalic and atraumatic.  Right Ear: External ear normal.  Left Ear: External ear normal.  Nose: Mucosal edema and rhinorrhea present.  Mouth/Throat: No oropharyngeal exudate or posterior oropharyngeal erythema.  Eyes: Conjunctivae and EOM are normal. Pupils are equal, round, and reactive to light. Right eye exhibits no discharge. Left eye exhibits no discharge. No scleral icterus.  Neck: Normal range of motion. Neck supple. No tracheal deviation present. No thyromegaly present.  Cardiovascular: Normal rate, regular rhythm and normal heart sounds.  Exam reveals no gallop and no friction rub.   No murmur heard. Pulmonary/Chest: Effort normal. No accessory muscle usage. Not tachypneic. No respiratory distress. He has no decreased breath sounds. He has no wheezes. He has rhonchi (scattered). He has no rales. He exhibits no tenderness.  Musculoskeletal: Normal range of motion. He exhibits no edema.  Lymphadenopathy:    He has no cervical adenopathy.  Neurological: He is alert and oriented to person, place, and time. No cranial nerve deficit. Coordination normal.  Skin: Skin is warm and dry. No rash noted. He is not diaphoretic. No erythema. No pallor.  Psychiatric: He has a normal mood and affect. His behavior is normal. Judgment and thought content normal.  Assessment & Plan:

## 2013-08-22 NOTE — Assessment & Plan Note (Signed)
Symptoms most consistent with viral URI with early secondary bronchitis. Will start Azithromycin. Tessalon as needed for cough. Encouraged rest, adequate fluid intake. Follow up if symptoms not improving over next few days.

## 2013-08-22 NOTE — Assessment & Plan Note (Signed)
Pt notes recent blurred vision, particularly at night. Will set up general eye exam.

## 2013-08-22 NOTE — Progress Notes (Signed)
Pre-visit discussion using our clinic review tool. No additional management support is needed unless otherwise documented below in the visit note.  

## 2013-08-23 ENCOUNTER — Other Ambulatory Visit: Payer: Self-pay | Admitting: Internal Medicine

## 2013-08-25 ENCOUNTER — Telehealth: Payer: Self-pay | Admitting: Internal Medicine

## 2013-08-25 NOTE — Telephone Encounter (Signed)
Prescription was re-sent today to pharmacy

## 2013-08-25 NOTE — Telephone Encounter (Signed)
States his metformin was to be called in on Friday but the pharmacy told him they never received it.

## 2013-09-10 ENCOUNTER — Telehealth: Payer: Self-pay | Admitting: Internal Medicine

## 2013-09-10 DIAGNOSIS — R400 Somnolence: Secondary | ICD-10-CM

## 2013-09-10 NOTE — Telephone Encounter (Signed)
The patient is wanting a referral to have a sleep study done. For sleep Apnea.

## 2013-09-10 NOTE — Telephone Encounter (Signed)
Left detailed message on patient voicemail Dr. Dan HumphreysWalker is out of this office and I forward this information to her. This will be handled when Dr. Dan HumphreysWalker is back in office next week.

## 2013-09-11 NOTE — Telephone Encounter (Signed)
Spoke patient yesterday informed him Dr. Dan HumphreysWalker was out of the office until next week and this will be handled once she returns. Patient asked if he could call the sleep clinic himself, I was not sure but informed he could call to see

## 2013-09-17 NOTE — Telephone Encounter (Signed)
I made this referral back in 03/2013. What happened to this study?

## 2013-09-17 NOTE — Telephone Encounter (Signed)
According to the note scanned into the patients chart he "no-showed" for his apt that was scheduled in Sept for his sleep study with [facility].

## 2013-09-17 NOTE — Telephone Encounter (Signed)
Patient returned call and informed him another referral has been placed and he must keep this appointment. Per patient he will and someone will give him a call in reference to this.

## 2013-09-17 NOTE — Telephone Encounter (Signed)
OK. I have placed another referral. Please let him know that he must show at this visit.

## 2013-09-17 NOTE — Telephone Encounter (Signed)
Left message to call back  

## 2013-10-03 ENCOUNTER — Ambulatory Visit: Payer: Self-pay | Admitting: Internal Medicine

## 2013-10-08 ENCOUNTER — Telehealth: Payer: Self-pay | Admitting: *Deleted

## 2013-10-08 DIAGNOSIS — R400 Somnolence: Secondary | ICD-10-CM

## 2013-10-08 NOTE — Telephone Encounter (Signed)
Would like to know the results of his sleep study. The report is in your folder.

## 2013-10-09 NOTE — Telephone Encounter (Signed)
Sleep study did not show significant sleep apnea, however they did recommend evaluation for narcolepsy. I would recommend referral to sleep specialist. Dr. Bethel BornHunter Hearn at [facility] is the only local doctor I know of.

## 2013-10-10 NOTE — Telephone Encounter (Signed)
Spoke with patient he verbalized understanding and agree with plan to see specialist.

## 2013-10-27 ENCOUNTER — Encounter: Payer: Self-pay | Admitting: Internal Medicine

## 2013-11-25 ENCOUNTER — Other Ambulatory Visit: Payer: Self-pay | Admitting: Internal Medicine

## 2013-11-25 NOTE — Telephone Encounter (Signed)
Electronic Rx request, 2nd request and pt does not have any future appts scheduled. Pt has not kept pervious scheduled appts. Please advise.

## 2014-02-02 ENCOUNTER — Other Ambulatory Visit: Payer: Self-pay | Admitting: Internal Medicine

## 2014-02-19 ENCOUNTER — Encounter: Payer: Self-pay | Admitting: Internal Medicine

## 2014-02-19 ENCOUNTER — Ambulatory Visit: Payer: BC Managed Care – PPO | Admitting: Internal Medicine

## 2014-02-19 DIAGNOSIS — Z0289 Encounter for other administrative examinations: Secondary | ICD-10-CM

## 2014-03-10 ENCOUNTER — Other Ambulatory Visit: Payer: Self-pay | Admitting: Internal Medicine

## 2014-03-10 NOTE — Telephone Encounter (Signed)
He has not had labs (a1c, etc) in over one year.  He needs an appt and labs asap.  See me if problems.

## 2014-03-10 NOTE — Telephone Encounter (Signed)
Pt has had 5 no shows within the year.  Last refill 6.25.15, last OV 1.9.15 with acute issue.  Please advise in Dr Sonny DandyWalkers absence

## 2014-03-12 ENCOUNTER — Telehealth: Payer: Self-pay | Admitting: Adult Health

## 2014-03-12 ENCOUNTER — Telehealth: Payer: Self-pay | Admitting: *Deleted

## 2014-03-12 ENCOUNTER — Ambulatory Visit: Payer: BC Managed Care – PPO | Admitting: Adult Health

## 2014-03-12 NOTE — Telephone Encounter (Signed)
Pt coming in tomorrow what labs and dx?  

## 2014-03-12 NOTE — Telephone Encounter (Signed)
Pt called to reschedule today's appt at 1045 to tomorrow. Is it okay to cancel today's appt

## 2014-03-13 ENCOUNTER — Ambulatory Visit: Payer: BC Managed Care – PPO | Admitting: Adult Health

## 2014-03-13 DIAGNOSIS — Z0289 Encounter for other administrative examinations: Secondary | ICD-10-CM

## 2014-03-13 NOTE — Telephone Encounter (Signed)
You can take off schedule

## 2014-03-13 NOTE — Telephone Encounter (Signed)
Do you want to leave on schedule for yesterday for late cancellation charge?

## 2014-03-16 ENCOUNTER — Ambulatory Visit: Payer: BC Managed Care – PPO | Admitting: Internal Medicine

## 2014-03-16 ENCOUNTER — Encounter: Payer: Self-pay | Admitting: Internal Medicine

## 2014-03-16 ENCOUNTER — Ambulatory Visit (INDEPENDENT_AMBULATORY_CARE_PROVIDER_SITE_OTHER): Payer: BC Managed Care – PPO | Admitting: Internal Medicine

## 2014-03-16 VITALS — BP 132/80 | HR 86 | Temp 98.5°F | Ht 66.0 in | Wt 211.5 lb

## 2014-03-16 DIAGNOSIS — E1165 Type 2 diabetes mellitus with hyperglycemia: Secondary | ICD-10-CM

## 2014-03-16 DIAGNOSIS — I10 Essential (primary) hypertension: Secondary | ICD-10-CM

## 2014-03-16 DIAGNOSIS — IMO0001 Reserved for inherently not codable concepts without codable children: Secondary | ICD-10-CM

## 2014-03-16 DIAGNOSIS — IMO0002 Reserved for concepts with insufficient information to code with codable children: Secondary | ICD-10-CM

## 2014-03-16 DIAGNOSIS — E669 Obesity, unspecified: Secondary | ICD-10-CM | POA: Insufficient documentation

## 2014-03-16 DIAGNOSIS — E785 Hyperlipidemia, unspecified: Secondary | ICD-10-CM

## 2014-03-16 DIAGNOSIS — R945 Abnormal results of liver function studies: Secondary | ICD-10-CM

## 2014-03-16 DIAGNOSIS — R7989 Other specified abnormal findings of blood chemistry: Secondary | ICD-10-CM

## 2014-03-16 LAB — MICROALBUMIN / CREATININE URINE RATIO
Creatinine,U: 77 mg/dL
MICROALB UR: 0.2 mg/dL (ref 0.0–1.9)
Microalb Creat Ratio: 0.3 mg/g (ref 0.0–30.0)

## 2014-03-16 LAB — COMPREHENSIVE METABOLIC PANEL
ALBUMIN: 4 g/dL (ref 3.5–5.2)
ALK PHOS: 86 U/L (ref 39–117)
ALT: 18 U/L (ref 0–53)
AST: 16 U/L (ref 0–37)
BUN: 15 mg/dL (ref 6–23)
CO2: 26 mEq/L (ref 19–32)
CREATININE: 1.1 mg/dL (ref 0.4–1.5)
Calcium: 8.8 mg/dL (ref 8.4–10.5)
Chloride: 101 mEq/L (ref 96–112)
GFR: 95.5 mL/min (ref >60.00)
GLUCOSE: 224 mg/dL — AB (ref 70–99)
POTASSIUM: 4.1 meq/L (ref 3.5–5.1)
Sodium: 132 mEq/L — ABNORMAL LOW (ref 135–145)
Total Bilirubin: 0.6 mg/dL (ref 0.2–1.2)
Total Protein: 6.9 g/dL (ref 6.0–8.3)

## 2014-03-16 LAB — HEMOGLOBIN A1C: HEMOGLOBIN A1C: 7.8 % — AB (ref 4.6–6.5)

## 2014-03-16 LAB — LIPID PANEL
CHOLESTEROL: 169 mg/dL (ref 0–200)
HDL: 42.3 mg/dL (ref >39.00)
LDL CALC: 90 mg/dL (ref 0–99)
NonHDL: 126.7
TRIGLYCERIDES: 185 mg/dL — AB (ref 0.0–149.0)
Total CHOL/HDL Ratio: 4
VLDL: 37 mg/dL (ref 0.0–40.0)

## 2014-03-16 LAB — HM DIABETES FOOT EXAM: HM Diabetic Foot Exam: NORMAL

## 2014-03-16 MED ORDER — METFORMIN HCL 1000 MG PO TABS: 1000.0000 mg | ORAL_TABLET | Freq: Two times a day (BID) | ORAL | Status: DC

## 2014-03-16 MED ORDER — GLUCOSE BLOOD VI STRP: ORAL_STRIP | Status: DC

## 2014-03-16 MED ORDER — GLIPIZIDE ER 5 MG PO TB24: 5.0000 mg | ORAL_TABLET | Freq: Every day | ORAL | Status: DC

## 2014-03-16 MED ORDER — LISINOPRIL 5 MG PO TABS: 5.0000 mg | ORAL_TABLET | Freq: Every day | ORAL | Status: AC

## 2014-03-16 NOTE — Assessment & Plan Note (Signed)
Will repeat LFTs with labs today.

## 2014-03-16 NOTE — Assessment & Plan Note (Signed)
Will check lipids and LFTs with labs today. 

## 2014-03-16 NOTE — Assessment & Plan Note (Addendum)
Wt Readings from Last 3 Encounters:  03/16/14 211 lb 8 oz (95.936 kg)  08/22/13 208 lb (94.348 kg)  01/29/13 215 lb (97.523 kg)   Encouraged healthy diet and encouraged him to keep a food log. Encouraged exercise 15-5030min at least 3x per week.

## 2014-03-16 NOTE — Progress Notes (Signed)
Subjective:    Patient ID: Timothy RobinsonShone D Friedman, male    DOB: 1971-01-30, 43 y.o.   MRN: [id number]  HPI 43YO male presents for follow up.  DM - Does not check BG. Compliant with Metformin. Has lost insurance, so unable to afford test strips for last few months. No recent opthalmology follow up.  Obesity - would like to be considered for bariatric surgery. Working on improving diet and exercise.  No new concerns today.  Review of Systems  Constitutional: Negative for fever, chills, activity change, appetite change, fatigue and unexpected weight change.  Eyes: Negative for visual disturbance.  Respiratory: Negative for cough and shortness of breath.   Cardiovascular: Negative for chest pain, palpitations and leg swelling.  Gastrointestinal: Negative for abdominal pain and abdominal distention.  Genitourinary: Negative for dysuria, urgency and difficulty urinating.  Musculoskeletal: Negative for arthralgias and gait problem.  Skin: Negative for color change and rash.  Hematological: Negative for adenopathy.  Psychiatric/Behavioral: Negative for sleep disturbance and dysphoric mood. The patient is not nervous/anxious.        Objective:    BP 132/80  Pulse 86  Temp(Src) 98.5 F (36.9 C) (Oral)  Ht 5\' 6"  (1.676 m)  Wt 211 lb 8 oz (95.936 kg)  BMI 34.15 kg/m2  SpO2 97% Physical Exam  Constitutional: He is oriented to person, place, and time. He appears well-developed and well-nourished. No distress.  HENT:  Head: Normocephalic and atraumatic.  Right Ear: External ear normal.  Left Ear: External ear normal.  Nose: Nose normal.  Mouth/Throat: Oropharynx is clear and moist. No oropharyngeal exudate.  Eyes: Conjunctivae and EOM are normal. Pupils are equal, round, and reactive to light. Right eye exhibits no discharge. Left eye exhibits no discharge. No scleral icterus.  Neck: Normal range of motion. Neck supple. No tracheal deviation present. No thyromegaly present.    Cardiovascular: Normal rate, regular rhythm and normal heart sounds.  Exam reveals no gallop and no friction rub.   No murmur heard. Pulmonary/Chest: Effort normal and breath sounds normal. No accessory muscle usage. Not tachypneic. No respiratory distress. He has no decreased breath sounds. He has no wheezes. He has no rhonchi. He has no rales. He exhibits no tenderness.  Musculoskeletal: Normal range of motion. He exhibits no edema.  Lymphadenopathy:    He has no cervical adenopathy.  Neurological: He is alert and oriented to person, place, and time. No cranial nerve deficit. Coordination normal.  Skin: Skin is warm and dry. No rash noted. He is not diaphoretic. No erythema. No pallor.  Psychiatric: He has a normal mood and affect. His behavior is normal. Judgment and thought content normal.          Assessment & Plan:   Problem List Items Addressed This Visit     Unprioritized   Diabetes mellitus type 2, uncontrolled - Primary     Will check A1c with labs today. Foot exam normal today. Not on statin because of elevated LFTs in the past. Will recheck LFTs today.    Relevant Medications      metFORMIN (GLUCOPHAGE) tablet      glipiZIDE (GLUCOTROL XL) 24 hr tablet      lisinopril (PRINIVIL,ZESTRIL) tablet   Other Relevant Orders      Comprehensive metabolic panel      Hemoglobin A1c      Lipid panel      Microalbumin / creatinine urine ratio      Ambulatory referral to Ophthalmology   Elevated LFTs  Will repeat LFTs with labs today.    Hypertension      BP Readings from Last 3 Encounters:  03/16/14 132/80  08/22/13 128/80  01/29/13 110/80   BP well controlled on lisinopril. Will recheck renal function with labs.    Relevant Medications      lisinopril (PRINIVIL,ZESTRIL) tablet   Obesity (BMI 30-39.9)      Wt Readings from Last 3 Encounters:  03/16/14 211 lb 8 oz (95.936 kg)  08/22/13 208 lb (94.348 kg)  01/29/13 215 lb (97.523 kg)   Encouraged healthy diet  and encouraged him to keep a food log. Encouraged exercise 15-36min at least 3x per week.    Relevant Medications      metFORMIN (GLUCOPHAGE) tablet      glipiZIDE (GLUCOTROL XL) 24 hr tablet   Other and unspecified hyperlipidemia     Will check lipids and LFTs with labs today.    Relevant Medications      lisinopril (PRINIVIL,ZESTRIL) tablet       Return in about 4 weeks (around 04/13/2014) for Recheck Weight.

## 2014-03-16 NOTE — Assessment & Plan Note (Signed)
Will check A1c with labs today. Foot exam normal today. Not on statin because of elevated LFTs in the past. Will recheck LFTs today.

## 2014-03-16 NOTE — Assessment & Plan Note (Signed)
BP Readings from Last 3 Encounters:  03/16/14 132/80  08/22/13 128/80  01/29/13 110/80   BP well controlled on lisinopril. Will recheck renal function with labs.

## 2014-03-16 NOTE — Progress Notes (Signed)
Pre visit review using our clinic review tool, if applicable. No additional management support is needed unless otherwise documented below in the visit note. 

## 2014-03-16 NOTE — Patient Instructions (Addendum)
Labs today.  Start recording food intake with My[employer]itness[employer] or other similar app. Goal of exercise by walking 5915min-30min 3 times per week.  Follow up in 4 weeks.

## 2014-04-13 ENCOUNTER — Ambulatory Visit: Payer: BC Managed Care – PPO | Admitting: Internal Medicine

## 2014-04-22 ENCOUNTER — Encounter: Payer: Self-pay | Admitting: *Deleted

## 2014-04-24 ENCOUNTER — Telehealth: Payer: Self-pay | Admitting: Internal Medicine

## 2014-04-24 NOTE — Telephone Encounter (Addendum)
Patient dismissed from [facility] by Ronna Polio MD , effective April 22, 2014. Dismissal letter sent out by certified / registered mail. DAJ  Received signed domestic return receipt verifying delivery of certified letter on September 16,2015 . Article number [id number]

## 2014-05-21 ENCOUNTER — Ambulatory Visit: Payer: BC Managed Care – PPO | Admitting: Internal Medicine

## 2014-05-22 ENCOUNTER — Ambulatory Visit: Payer: BC Managed Care – PPO | Admitting: Internal Medicine

## 2014-09-22 ENCOUNTER — Ambulatory Visit: Payer: Self-pay | Admitting: Family Medicine

## 2014-11-02 ENCOUNTER — Emergency Department (HOSPITAL_COMMUNITY)
Admission: EM | Admit: 2014-11-02 | Discharge: 2014-11-03 | Disposition: A | Discharge status: Home / Self Care | Payer: BLUE CROSS/BLUE SHIELD | Attending: Emergency Medicine | Admitting: Emergency Medicine

## 2014-11-02 ENCOUNTER — Emergency Department (HOSPITAL_COMMUNITY): Payer: BLUE CROSS/BLUE SHIELD

## 2014-11-02 ENCOUNTER — Encounter (HOSPITAL_COMMUNITY): Payer: Self-pay | Admitting: Family Medicine

## 2014-11-02 DIAGNOSIS — R079 Chest pain, unspecified: Secondary | ICD-10-CM | POA: Diagnosis present

## 2014-11-02 DIAGNOSIS — I10 Essential (primary) hypertension: Secondary | ICD-10-CM | POA: Insufficient documentation

## 2014-11-02 DIAGNOSIS — Z79899 Other long term (current) drug therapy: Secondary | ICD-10-CM | POA: Insufficient documentation

## 2014-11-02 DIAGNOSIS — E119 Type 2 diabetes mellitus without complications: Secondary | ICD-10-CM | POA: Diagnosis not present

## 2014-11-02 LAB — BASIC METABOLIC PANEL
Anion gap: 7 (ref 5–15)
BUN: 13 mg/dL (ref 6–23)
CALCIUM: 9.4 mg/dL (ref 8.4–10.5)
CO2: 25 mmol/L (ref 19–32)
Chloride: 106 mmol/L (ref 96–112)
Creatinine, Ser: 1.08 mg/dL (ref 0.50–1.35)
GFR calc non Af Amer: 83 mL/min — ABNORMAL LOW (ref >90)
Glucose, Bld: 157 mg/dL — ABNORMAL HIGH (ref 70–99)
Potassium: 4.2 mmol/L (ref 3.5–5.1)
Sodium: 138 mmol/L (ref 135–145)

## 2014-11-02 LAB — CBC
HEMATOCRIT: 40.2 % (ref 39.0–52.0)
HEMOGLOBIN: 13.4 g/dL (ref 13.0–17.0)
MCH: 29.7 pg (ref 26.0–34.0)
MCHC: 33.3 g/dL (ref 30.0–36.0)
MCV: 89.1 fL (ref 78.0–100.0)
Platelets: 261 10*3/uL (ref 150–400)
RBC: 4.51 MIL/uL (ref 4.22–5.81)
RDW: 13.6 % (ref 11.5–15.5)
WBC: 4.6 10*3/uL (ref 4.0–10.5)

## 2014-11-02 LAB — I-STAT TROPONIN, ED: Troponin i, poc: 0.01 ng/mL (ref 0.00–0.08)

## 2014-11-02 NOTE — ED Provider Notes (Signed)
CSN: [id number]     Arrival date & time 11/02/14  1850 History   First MD Initiated Contact with Patient 11/02/14 2118     Chief Complaint  Patient presents with  . Chest Pain     (Consider location/radiation/quality/duration/timing/severity/associated sxs/prior Treatment) HPI Mr. Timothy Friedman is a 44 y.o black male who has a history of DM and HTN who presents for gradual onset, intermittent, aching chest pain with expiration and numbness and tingling of his right arm that began 5 hours ago while he was driving.  He says the pain is about 5/10 when it did occur but has resolved since. He denies taking anything for pain prior to arrival.  Breathing and coughing makes it worse and nothing makes it better.  He has had a cough for several weeks now.  He denies any shortness of breath, nausea, vomiting, syncope, or light headedness.  Past Medical History  Diagnosis Date  . Diabetes mellitus   . Hypertension    Past Surgical History  Procedure Laterality Date  . Laceration repair      right forearm   Family History  Problem Relation Age of Onset  . Diabetes Mother   . Heart disease Father 3086  . Diabetes Brother    History  Substance Use Topics  . Smoking status: Never Smoker   . Smokeless tobacco: Never Used  . Alcohol Use: No    Review of Systems  Constitutional: Negative for fever.  Cardiovascular: Negative for palpitations.  Neurological: Negative for weakness.  All other systems reviewed and are negative.     Allergies  Iodinated diagnostic agents and Shellfish allergy  Home Medications   Prior to Admission medications   Medication Sig Start Date End Date Taking? Authorizing Provider  FLUoxetine (PROZAC) 20 MG capsule Take 20 mg by mouth daily.   Yes Historical Provider, MD  glipiZIDE (GLIPIZIDE XL) 5 MG 24 hr tablet Take 1 tablet (5 mg total) by mouth daily with breakfast. 03/16/14  Yes Shelia MediaJennifer A Walker, MD  lisinopril (PRINIVIL,ZESTRIL) 5 MG tablet Take 1 tablet (5 mg  total) by mouth daily. 03/16/14 03/16/15 Yes Shelia MediaJennifer A Walker, MD  metFORMIN (GLUCOPHAGE) 1000 MG tablet Take 1 tablet (1,000 mg total) by mouth 2 (two) times daily with a meal. 03/16/14  Yes Shelia MediaJennifer A Walker, MD  Linaclotide [facility](LINZESS) 145 MCG CAPS Take 1 capsule (145 mcg total) by mouth daily. 12/11/12   Shelia MediaJennifer A Walker, MD   BP 131/93 mmHg  Pulse 65  Temp(Src) 98 F (36.7 C)  Resp 14  SpO2 99% Physical Exam  Constitutional: He is oriented to person, place, and time. He appears well-developed and well-nourished.  HENT:  Head: Normocephalic and atraumatic.  Eyes: Conjunctivae are normal.  Neck: Normal range of motion. Neck supple.  Cardiovascular: Normal rate, regular rhythm, normal heart sounds and normal pulses.   Pulmonary/Chest: Effort normal and breath sounds normal. No respiratory distress.  Abdominal: Soft. There is no tenderness.  Musculoskeletal: Normal range of motion.  No edema in the lower extremities. Good strength in upper and lower extremities bilaterally.    Neurological: He is alert and oriented to person, place, and time.  Skin: Skin is warm and dry.  Nursing note and vitals reviewed.   ED Course  Procedures (including critical care time) Labs Review Labs Reviewed  BASIC METABOLIC PANEL - Abnormal; Notable for the following:    Glucose, Bld 157 (*)    GFR calc non Af Amer 83 (*)    All other components  within normal limits  CBC  I-STAT TROPOININ, ED  Rosezena Sensor, ED    Imaging Review Dg Chest 2 View  11/02/2014   CLINICAL DATA:  Chest pain with deep breath. Diabetes. Hypertension.  EXAM: CHEST  2 VIEW  COMPARISON:  None.  FINDINGS: Midline trachea.  Normal heart size and mediastinal contours.  Sharp costophrenic angles.  No pneumothorax.  Clear lungs.  Lateral view degraded by patient arm position.  IMPRESSION: No active cardiopulmonary disease.   Electronically Signed   By: Jeronimo Greaves M.D.   On: 11/02/2014 19:58     EKG Interpretation None      Timothy Friedman, Timothy Friedman EKG  Vent rate 70 BPM PR interval 150 ms QRS duration 78 ms QT/QTc 358/386 ms PRT axes 55 25 -12 Normal sinus rhythm   MDM   Final diagnoses:  Chest pain, unspecified chest pain type   Patient presents for gradual onset chest pain while driving 5 hours ago but has since resolved.  He states it is worse with expiration of air.   His exam is normal.  CXR shows not pneumonia, no pleural effusion, and no pneumothorax. His EKG is normal.  His labs and vitals are unremarkable.  I reviewed both the PERC and Heart score and he is at low risk.  He does have hypertension and diabetes.  He is not a smoker.  No previous DVT or PE. No recent travel or surgeries.   I have discussed this patient with Dr. Rubin Payor and he agrees with getting a second troponin.  Troponin is negative and I feel comfortable discharging him home with f/u with his pcp. I explained return precautions such as chest pain and shortness of breath and he agrees with the plan.     Catha Gosselin, PA-C 11/03/14 0030  Benjiman Core, MD 11/03/14 [phone]

## 2014-11-02 NOTE — Discharge Instructions (Signed)
Chest Pain (Nonspecific) °It is often hard to give a specific diagnosis for the cause of chest pain. There is always a chance that your pain could be related to something serious, such as a heart attack or a blood clot in the lungs. You need to follow up with your health care provider for further evaluation. °CAUSES  °· Heartburn. °· Pneumonia or bronchitis. °· Anxiety or stress. °· Inflammation around your heart (pericarditis) or lung (pleuritis or pleurisy). °· A blood clot in the lung. °· A collapsed lung (pneumothorax). It can develop suddenly on its own (spontaneous pneumothorax) or from trauma to the chest. °· Shingles infection (herpes zoster virus). °The chest wall is composed of bones, muscles, and cartilage. Any of these can be the source of the pain. °· The bones can be bruised by injury. °· The muscles or cartilage can be strained by coughing or overwork. °· The cartilage can be affected by inflammation and become sore (costochondritis). °DIAGNOSIS  °Lab tests or other studies may be needed to find the cause of your pain. Your health care provider may have you take a test called an ambulatory electrocardiogram (ECG). An ECG records your heartbeat patterns over a 24-hour period. You may also have other tests, such as: °· Transthoracic echocardiogram (TTE). During echocardiography, sound waves are used to evaluate how blood flows through your heart. °· Transesophageal echocardiogram (TEE). °· Cardiac monitoring. This allows your health care provider to monitor your heart rate and rhythm in real time. °· Holter monitor. This is a portable device that records your heartbeat and can help diagnose heart arrhythmias. It allows your health care provider to track your heart activity for several days, if needed. °· Stress tests by exercise or by giving medicine that makes the heart beat faster. °TREATMENT  °· Treatment depends on what may be causing your chest pain. Treatment may include: °¨ Acid blockers for  heartburn. °¨ Anti-inflammatory medicine. °¨ Pain medicine for inflammatory conditions. °¨ Antibiotics if an infection is present. °· You may be advised to change lifestyle habits. This includes stopping smoking and avoiding alcohol, caffeine, and chocolate. °· You may be advised to keep your head raised (elevated) when sleeping. This reduces the chance of acid going backward from your stomach into your esophagus. °Most of the time, nonspecific chest pain will improve within 2-3 days with rest and mild pain medicine.  °HOME CARE INSTRUCTIONS  °· If antibiotics were prescribed, take them as directed. Finish them even if you start to feel better. °· For the next few days, avoid physical activities that bring on chest pain. Continue physical activities as directed. °· Do not use any tobacco products, including cigarettes, chewing tobacco, or electronic cigarettes. °· Avoid drinking alcohol. °· Only take medicine as directed by your health care provider. °· Follow your health care provider's suggestions for further testing if your chest pain does not go away. °· Keep any follow-up appointments you made. If you do not go to an appointment, you could develop lasting (chronic) problems with pain. If there is any problem keeping an appointment, call to reschedule. °SEEK MEDICAL CARE IF:  °· Your chest pain does not go away, even after treatment. °· You have a rash with blisters on your chest. °· You have a fever. °SEEK IMMEDIATE MEDICAL CARE IF:  °· You have increased chest pain or pain that spreads to your arm, neck, jaw, back, or abdomen. °· You have shortness of breath. °· You have an increasing cough, or you cough   up blood. °· You have severe back or abdominal pain. °· You feel nauseous or vomit. °· You have severe weakness. °· You faint. °· You have chills. °This is an emergency. Do not wait to see if the pain will go away. Get medical help at once. Call your local emergency services (911 in [place] Do not drive  yourself to the hospital. °MAKE SURE YOU:  °· Understand these instructions. °· Will watch your condition. °· Will get help right away if you are not doing well or get worse. °Document Released: 05/10/2005 Document Revised: 08/05/2013 Document Reviewed: 03/05/2008 °ExitCare® Patient Information ©2015 [employer]. This information is not intended to replace advice given to you by your health care provider. Make sure you discuss any questions you have with your health care provider. ° ° °Emergency Department Resource Guide °1) Find a Doctor and Pay Out of Pocket °Although you won't have to find out who is covered by your insurance plan, it is a good idea to ask around and get recommendations. You will then need to call the office and see if the doctor you have chosen will accept you as a new patient and what types of options they offer for patients who are self-pay. Some doctors offer discounts or will set up payment plans for their patients who do not have insurance, but you will need to ask so you aren't surprised when you get to your appointment. ° °2) Contact Your Local Health Department °Not all health departments have doctors that can see patients for sick visits, but many do, so it is worth a call to see if yours does. If you don't know where your local health department is, you can check in your phone book. The CDC also has a tool to help you locate your state's health department, and many state websites also have listings of all of their local health departments. ° °3) Find a Walk-in Clinic °If your illness is not likely to be very severe or complicated, you may want to try a walk in clinic. These are popping up all over the country in pharmacies, drugstores, and shopping centers. They're usually staffed by nurse practitioners or physician assistants that have been trained to treat common illnesses and complaints. They're usually fairly quick and inexpensive. However, if you have serious medical issues or  chronic medical problems, these are probably not your best option. ° °No Primary Care Doctor: °- Call Health Connect at  832-8000 - they can help you locate a primary care doctor that  accepts your insurance, provides certain services, etc. °- Physician Referral Service- 1-800-533-3463 ° °Chronic Pain Problems: °Organization         Address  Phone   Notes  °[facility]  ([phone]) 297-2271 Patients need to be referred by their primary care doctor.  ° °Medication Assistance: °Organization         Address  Phone   Notes  °[place] [facility] [address] °[place], [place] [address] (336) 641-8030 --Must be a resident of [place] °-- Must have NO insurance coverage whatsoever (no Medicaid/ Medicare, etc.) °-- The pt. MUST have a primary care doctor that directs their care regularly and follows them in the community °  °[facility]  (866) 331-1348   °[employer] Way  (888) 892-1162   ° °Agencies that provide inexpensive medical care: °Organization         Address  Phone   Notes  °[facility]  (  336) 832-8035   °[facility]    (336) 832-7272   °[facility] [address] °[place], [place] [address] (336) 832-4777   °[facility] [address], °[place] (336) 271-4999   °[facility]    (336) 373-0678   °[facility]    (336) 272-1050   °[facility] ° [address], [place] Phone:  (336) 832-4444, Fax:  (336) 832-4440 Hours of Operation:  9 am - 6 pm, M-F.  Also accepts Medicaid/Medicare and self-pay.  °[facility] ° [address], [place] Phone: (336) [phone], Fax: (336) [phone]. Hours of Operation:  8:30 am - 5:30 pm, M-F.  Also accepts Medicaid and self-pay.  °[facility] [place] [address], [place] Phone: (336) 878-6027   °[facility] [address], [place], [place]  (336)723-1848, Ext. 123 Mondays & Thursdays: 7-9 AM.  First 15 patients are seen on a first come, first serve basis. °  ° °Medicaid-accepting [place] Providers: ° °Organization         Address  Phone   Notes  °[facility] [address], [place] (336) [phone] Also accepts self-pay patients.  °[facility] [address], [place] ° (336) 856-9996   °[facility] [address], [place] (336) 288-8857   °[facility] [address] [place] [address], [place] (336) 299-7000   °Veita Bland [address], [place]  ° (336) 373-1557 Only accepts [place] Access Medicaid patients after they have their name applied to their card.  ° °Self-Pay (no insurance) in [place]: ° °Organization         Address  Phone   Notes  °Sickle Cell Patients, [facility] [address], [place] (336) 832-1970   °[facility] [address], [place] (336) 832-4400   °[facility] ° [address], [place] (336) 992-4800   °[facility]/Dr. Osei-Bonsu ° [address] [place] [address], [place] or [address], [place] (336) [phone] Phone number for both [place] and [place] locations is the same.  °[facility] [address], [place] (336) [phone]   °[facility] [address] [place] [address], [place] or [address] (336) 852-7530 °(336) 878-2260   °[facility] [address], [place] (336) 350-1642, phone; (336) 294-5005, fax Sees patients 1st and 3rd Saturday of every month.  Must not qualify for public or private insurance (i.e. Medicaid, Medicare, [place] Health Choice, Veterans' Benefits) • Household income should be no more than 200% of the poverty level •The clinic cannot treat you if you are pregnant or think you are pregnant • Sexually transmitted  diseases are not treated at the clinic.  ° ° °Dental Care: °Organization         Address  Phone  Notes  °[place] [facility] [address],  (336) 641-6152 Accepts children up to age 21 who are enrolled in Medicaid or [place] Health Choice; pregnant women with a Medicaid card; and children who have applied for Medicaid or [place] Health Choice, but were declined, whose parents can pay a reduced fee at time of service.  °[place] [facility] [place]    [address], [place] (336) 641-7733 Accepts children up to age 21 who are enrolled in Medicaid or [place] Health Choice; pregnant women with a Medicaid card; and children who have applied for Medicaid or [place] Health Choice, but were declined, whose parents can pay a reduced fee at time of service.  °[facility] ° [address], [place] (336) 641-4533 Patients are seen by appointment only. Walk-ins are not accepted. [facility] will see patients 18 years of age and older. °Monday - Tuesday (8am-5pm) °Most Wednesdays (8:30-5pm) °$30 per visit, cash only  °[facility] ° [address], [place] (336) 641-4533 Patients are seen by appointment only. Walk-ins are not accepted. [facility] will see patients 18 years of age and older. °One Wednesday Evening (Monthly: Volunteer Based).  $30 per visit, cash only  °[facility]  (919) [phone] for adults; Children under age 4, call Graduate Pediatric Dentistry at ([phone]) [phone]. Children aged 4-14, please call ([phone]) [phone] to request a pediatric application. ° Dental services are provided in all areas of dental care including fillings, crowns and bridges, complete and partial dentures, implants, gum treatment, root canals, and extractions. Preventive care is also provided. Treatment is provided to both adults and children. °Patients are selected via a  lottery and there is often a waiting list. °  °[facility] [address], °[place] ° (336) 763-8833 [email or web address] °  °[facility] [address], [place], [place] (336)723-1848, Ext. 123 Second and Fourth Thursday of each month, opens at 6:30 AM; Clinic ends at 9 AM.  Patients are seen on a first-come first-served basis, and a limited number are seen during each clinic.  ° °[facility] ° [address], [place], [place] (336) 723-7904   Eligibility Requirements °You must have lived in [place], [place], or [place] counties for at least the last three months. °  You cannot be eligible for state or federal sponsored healthcare insurance, including Veterans Administration, Medicaid, or Medicare. °  You generally cannot be eligible for healthcare insurance through your employer.  °  How to apply: °Eligibility screenings are held every Tuesday and Wednesday afternoon from 1:00 pm until 4:00 pm. You do not need an appointment for the interview!  °[facility] [address], [place], [place] 336-631-2330   °[facility]  336-342-8273   °[facility]  336-703-3100   °[facility]  336-570-6415   ° °Behavioral Health Resources in the Community: °Intensive Outpatient Programs °Organization         Address  Phone  Notes  °[facility] [place] [facility] [address], [place], [place] 336-878-6098   °[facility] [address], [place], [place] 336-832-9800   °[facility] [address], [place], [place] ° 336-882-2125   °[facility] [address],  °[place],  1-800-853-5163 or 336-641-4981   °Substance Abuse Resources °Organization         Address  Phone  Notes  °[facility]  336-882-2125   °[facility]  336-784-9470   °[facility]  336-285-9073   °[facility]  336-845-3988   °Residential &  Outpatient Substance Abuse Program  1-800-659-3381   °Psychological Services °Organization         Address  Phone  Notes  ° Health  336- 832-9600   °  [facility]  763 [phone]- (239)[phone]   [facility] [address], [place] [phone]) [phone] or [phone]    Mobile Crisis Teams Organization         Address  Phone  Notes  [facility], Mobile Crisis Care Unit  (747) [phone]   [facility]  [address] [place], [place] [phone]   Doristine LocksSharon DeEsch [address] [place] [phone]    Self-Help/Support Groups Organization         Address  Phone             Notes  [facility] Assoc. of  - variety of support groups  336- I7437963212 [phone] Call for more information  Narcotics Anonymous (NA), [facility] [address], [place]  2 meetings at this location   [facility] Treatment Programs Organization         Address  Phone  Notes  [facility] [address],    [place]  [phone]   [facility]  [address], [place] [phone], [place], [place] [phone]   [facility] [address], [place] [phone] Admissions: 8am-3pm M-F  [facility] [address]    [place], [place] [phone]   [facility] [address], [place], [place] [phone]   [facility] [address]  [place], [place] [phone]   [facility] Intensive Outpatient [address], [place], [place] [phone]   [facility] ([facility] [address]  [place], [place] [phone] or [phone]   [facility] ([facility]) [address] [place], [place] [phone] Accepts Medicaid  [facility] [address]  [place] [phone] Substance Abuse/Addiction Treatment   [facility] Resources Organization          Address  Phone  Notes  [facility]  (431)[phone]([phone]) [phone]   Angie FavaJulie Brannon, PhD [address], [address] [place], [place]   [phone](336) [phone] or [phone](336) [phone]   [facility]   [address] [place], [place] [phone]) [phone]   [facility] [address], [place], [place] [phone]) [phone] Insurance/Medicaid/sponsorship through [facility]  [facility] [address]                                    [place], [place] [phone]) [phone] Therapy/tele-psych/case  [facility] [address].   [place], [place] [phone]) [phone]    Dr. Lolly MustacheArfeen  [phone](336) [phone]   [facility]  [employer] [facility] 1) [address], [place] 2) [address], [place] 3)  [address], [place] [phone](336) [phone](336) [phone]  [phone](336) [phone]   [facility] Child Abuse Hotline [phone](336) [phone] or [phone](336) [phone] (After Hours)      Return for chest pain, shortness of breath, dizziness, lightheadedness.

## 2014-11-02 NOTE — ED Notes (Addendum)
Per pt sts chest pain and let arm pain and numbness that started today.denies sob. sts hurts worse with breathing and he has been coughing.

## 2014-11-03 LAB — I-STAT TROPONIN, ED: TROPONIN I, POC: 0 ng/mL (ref 0.00–0.08)

## 2014-11-17 ENCOUNTER — Encounter (HOSPITAL_COMMUNITY): Payer: Self-pay | Admitting: *Deleted

## 2014-11-17 ENCOUNTER — Emergency Department (HOSPITAL_COMMUNITY)
Admission: EM | Admit: 2014-11-17 | Discharge: 2014-11-18 | Disposition: A | Discharge status: Home / Self Care | Payer: BLUE CROSS/BLUE SHIELD | Attending: Emergency Medicine | Admitting: Emergency Medicine

## 2014-11-17 ENCOUNTER — Emergency Department (HOSPITAL_COMMUNITY): Payer: BLUE CROSS/BLUE SHIELD

## 2014-11-17 DIAGNOSIS — I10 Essential (primary) hypertension: Secondary | ICD-10-CM | POA: Insufficient documentation

## 2014-11-17 DIAGNOSIS — W11XXXA Fall on and from ladder, initial encounter: Secondary | ICD-10-CM | POA: Diagnosis not present

## 2014-11-17 DIAGNOSIS — Y9389 Activity, other specified: Secondary | ICD-10-CM | POA: Insufficient documentation

## 2014-11-17 DIAGNOSIS — S4992XA Unspecified injury of left shoulder and upper arm, initial encounter: Secondary | ICD-10-CM | POA: Insufficient documentation

## 2014-11-17 DIAGNOSIS — S99912A Unspecified injury of left ankle, initial encounter: Secondary | ICD-10-CM | POA: Diagnosis not present

## 2014-11-17 DIAGNOSIS — S80812A Abrasion, left lower leg, initial encounter: Secondary | ICD-10-CM | POA: Diagnosis not present

## 2014-11-17 DIAGNOSIS — W19XXXA Unspecified fall, initial encounter: Secondary | ICD-10-CM

## 2014-11-17 DIAGNOSIS — E119 Type 2 diabetes mellitus without complications: Secondary | ICD-10-CM | POA: Insufficient documentation

## 2014-11-17 DIAGNOSIS — Y9289 Other specified places as the place of occurrence of the external cause: Secondary | ICD-10-CM | POA: Insufficient documentation

## 2014-11-17 DIAGNOSIS — Y998 Other external cause status: Secondary | ICD-10-CM | POA: Diagnosis not present

## 2014-11-17 LAB — BASIC METABOLIC PANEL
ANION GAP: 12 (ref 5–15)
BUN: 16 mg/dL (ref 6–23)
CALCIUM: 9.4 mg/dL (ref 8.4–10.5)
CHLORIDE: 102 mmol/L (ref 96–112)
CO2: 23 mmol/L (ref 19–32)
Creatinine, Ser: 0.98 mg/dL (ref 0.50–1.35)
GFR calc Af Amer: >90 mL/min (ref >90)
GFR calc non Af Amer: >90 mL/min (ref >90)
GLUCOSE: 128 mg/dL — AB (ref 70–99)
Potassium: 4.3 mmol/L (ref 3.5–5.1)
SODIUM: 137 mmol/L (ref 135–145)

## 2014-11-17 LAB — I-STAT TROPONIN, ED: Troponin i, poc: 0 ng/mL (ref 0.00–0.08)

## 2014-11-17 LAB — CBC WITH DIFFERENTIAL/PLATELET
BASOS ABS: 0 10*3/uL (ref 0.0–0.1)
Basophils Relative: 0 % (ref 0–1)
Eosinophils Absolute: 0.1 10*3/uL (ref 0.0–0.7)
Eosinophils Relative: 1 % (ref 0–5)
HCT: 41.8 % (ref 39.0–52.0)
Hemoglobin: 14.1 g/dL (ref 13.0–17.0)
LYMPHS ABS: 1.4 10*3/uL (ref 0.7–4.0)
LYMPHS PCT: 23 % (ref 12–46)
MCH: 29.7 pg (ref 26.0–34.0)
MCHC: 33.7 g/dL (ref 30.0–36.0)
MCV: 88.2 fL (ref 78.0–100.0)
Monocytes Absolute: 0.3 10*3/uL (ref 0.1–1.0)
Monocytes Relative: 6 % (ref 3–12)
NEUTROS ABS: 4.2 10*3/uL (ref 1.7–7.7)
NEUTROS PCT: 70 % (ref 43–77)
PLATELETS: 270 10*3/uL (ref 150–400)
RBC: 4.74 MIL/uL (ref 4.22–5.81)
RDW: 13.5 % (ref 11.5–15.5)
WBC: 6 10*3/uL (ref 4.0–10.5)

## 2014-11-17 LAB — CBG MONITORING, ED: GLUCOSE-CAPILLARY: 133 mg/dL — AB (ref 70–99)

## 2014-11-17 MED ORDER — HYDROCODONE-ACETAMINOPHEN 5-325 MG PO TABS: 1.0000 | ORAL_TABLET | Freq: Four times a day (QID) | ORAL | Status: AC | PRN

## 2014-11-17 NOTE — Discharge Instructions (Signed)

## 2014-11-17 NOTE — ED Notes (Signed)
[facility] 133 

## 2014-11-17 NOTE — ED Provider Notes (Signed)
CSN: [id number]     Arrival date & time 11/17/14  1023 History   First MD Initiated Contact with Patient 11/17/14 1024     Chief Complaint  Patient presents with  . Loss of Consciousness  . Fall  . Shoulder Pain  . Leg Pain     (Consider location/radiation/quality/duration/timing/severity/associated sxs/prior Treatment) HPI Comments: Presents to the emergency department with chief complaint of fall/syncope. Patient states that he was climbing a ladder today when he missed a step and fell off the ladder. He states that he fell from about 6 feet. He was ambulatory after the fall. He complains of left shoulder pain and left ankle pain. States that he may have hit his head. He is uncertain whether he passed out. The pain is worsened with palpation. He has not taken anything to alleviate his symptoms. He denies any history of seizures, or syncope.  He states that his pain is mild.  The history is provided by the patient. No language interpreter was used.    Past Medical History  Diagnosis Date  . Diabetes mellitus   . Hypertension    Past Surgical History  Procedure Laterality Date  . Laceration repair      right forearm   Family History  Problem Relation Age of Onset  . Diabetes Mother   . Heart disease Father 65  . Diabetes Brother    History  Substance Use Topics  . Smoking status: Never Smoker   . Smokeless tobacco: Never Used  . Alcohol Use: No    Review of Systems  Constitutional: Negative for fever and chills.  Respiratory: Negative for shortness of breath.   Cardiovascular: Negative for chest pain.  Gastrointestinal: Negative for nausea, vomiting, diarrhea and constipation.  Genitourinary: Negative for dysuria.  Musculoskeletal: Positive for arthralgias.  All other systems reviewed and are negative.     Allergies  Iodinated diagnostic agents and Shellfish allergy  Home Medications   Prior to Admission medications   Medication Sig Start Date End Date  Taking? Authorizing Provider  FLUoxetine (PROZAC) 20 MG capsule Take 20 mg by mouth daily.   Yes Historical Provider, MD  glipiZIDE (GLIPIZIDE XL) 5 MG 24 hr tablet Take 1 tablet (5 mg total) by mouth daily with breakfast. 03/16/14  Yes Shelia Media, MD  lisinopril (PRINIVIL,ZESTRIL) 20 MG tablet Take 20 mg by mouth daily. 10/23/14  Yes Historical Provider, MD  metFORMIN (GLUCOPHAGE) 1000 MG tablet Take 1 tablet (1,000 mg total) by mouth 2 (two) times daily with a meal. 03/16/14  Yes Shelia Media, MD  HYDROcodone-acetaminophen (NORCO/VICODIN) 5-325 MG per tablet Take 1-2 tablets by mouth every 6 (six) hours as needed. 11/17/14   Roxy Horseman, PA-C  Linaclotide [facility]) 145 MCG CAPS Take 1 capsule (145 mcg total) by mouth daily. Patient not taking: Reported on 11/17/2014 12/11/12   Shelia Media, MD  lisinopril (PRINIVIL,ZESTRIL) 5 MG tablet Take 1 tablet (5 mg total) by mouth daily. Patient not taking: Reported on 11/17/2014 03/16/14 03/16/15  Shelia Media, MD   BP 145/92 mmHg  Pulse 68  Temp(Src) 97.7 F (36.5 C) (Oral)  Resp 16  SpO2 99% Physical Exam  Constitutional: He is oriented to person, place, and time. He appears well-developed and well-nourished.  HENT:  Head: Normocephalic and atraumatic.  Eyes: Conjunctivae and EOM are normal. Pupils are equal, round, and reactive to light. Right eye exhibits no discharge. Left eye exhibits no discharge. No scleral icterus.  Neck: Normal range of motion. Neck  supple. No JVD present.  Cardiovascular: Normal rate, regular rhythm and normal heart sounds.  Exam reveals no gallop and no friction rub.   No murmur heard. Pulmonary/Chest: Effort normal and breath sounds normal. No respiratory distress. He has no wheezes. He has no rales. He exhibits no tenderness.  Abdominal: Soft. He exhibits no distension and no mass. There is no tenderness. There is no rebound and no guarding.  Musculoskeletal: Normal range of motion. He exhibits no edema or  tenderness.  Moderate left shoulder pain, no bony abnormality or deformity, mild left ankle pain, no bony abnormality or deformity, range of motion strength of all extremities 5/5  Neurological: He is alert and oriented to person, place, and time.  Skin: Skin is warm and dry.  Mild skin abrasion to left shin no laceration  Psychiatric: He has a normal mood and affect. His behavior is normal. Judgment and thought content normal.  Nursing note and vitals reviewed.   ED Course  Procedures (including critical care time) Labs Review Labs Reviewed  BASIC METABOLIC PANEL - Abnormal; Notable for the following:    Glucose, Bld 128 (*)    All other components within normal limits  CBG MONITORING, ED - Abnormal; Notable for the following:    Glucose-Capillary 133 (*)    All other components within normal limits  CBC WITH DIFFERENTIAL/PLATELET  I-STAT TROPOININ, ED    Imaging Review Dg Ankle Complete Left  11/17/2014   CLINICAL DATA:  Fall from ladder today, lateral ankle pain  EXAM: LEFT ANKLE COMPLETE - 3+ VIEW  COMPARISON:  None.  FINDINGS: Three views of left ankle submitted. No acute fracture or subluxation. Ankle mortise is preserved.  IMPRESSION: Negative.   Electronically Signed   By: Natasha Mead M.D.   On: 11/17/2014 13:26   Ct Head Wo Contrast  11/17/2014   CLINICAL DATA:  Fall from ladder  EXAM: CT HEAD WITHOUT CONTRAST  CT CERVICAL SPINE WITHOUT CONTRAST  TECHNIQUE: Multidetector CT imaging of the head and cervical spine was performed following the standard protocol without intravenous contrast. Multiplanar CT image reconstructions of the cervical spine were also generated.  COMPARISON:  None.  FINDINGS: CT HEAD FINDINGS  No skull fracture is noted. Paranasal sinuses and mastoid air cells are unremarkable.  No intracranial hemorrhage, mass effect or midline shift.  No acute infarction. No hydrocephalus. No mass lesion is noted on this unenhanced scan.  CT CERVICAL SPINE FINDINGS  Axial  images of the cervical spine shows no acute fracture or subluxation.  Computer processed images shows no acute fracture or subluxation. No prevertebral soft tissue swelling. Cervical airway is patent. Spinal canal is patent  There is no pneumothorax in visualized lung apices.  IMPRESSION: 1. No acute intracranial abnormality. 2. No cervical spine acute fracture or subluxation.   Electronically Signed   By: Natasha Mead M.D.   On: 11/17/2014 12:47   Ct Cervical Spine Wo Contrast  11/17/2014   CLINICAL DATA:  Fall from ladder  EXAM: CT HEAD WITHOUT CONTRAST  CT CERVICAL SPINE WITHOUT CONTRAST  TECHNIQUE: Multidetector CT imaging of the head and cervical spine was performed following the standard protocol without intravenous contrast. Multiplanar CT image reconstructions of the cervical spine were also generated.  COMPARISON:  None.  FINDINGS: CT HEAD FINDINGS  No skull fracture is noted. Paranasal sinuses and mastoid air cells are unremarkable.  No intracranial hemorrhage, mass effect or midline shift.  No acute infarction. No hydrocephalus. No mass lesion is noted on this  unenhanced scan.  CT CERVICAL SPINE FINDINGS  Axial images of the cervical spine shows no acute fracture or subluxation.  Computer processed images shows no acute fracture or subluxation. No prevertebral soft tissue swelling. Cervical airway is patent. Spinal canal is patent  There is no pneumothorax in visualized lung apices.  IMPRESSION: 1. No acute intracranial abnormality. 2. No cervical spine acute fracture or subluxation.   Electronically Signed   By: Natasha MeadLiviu  Pop M.D.   On: 11/17/2014 12:47   Dg Shoulder Left  11/17/2014   CLINICAL DATA:  Fall from ladder today, lateral shoulder pain  EXAM: LEFT SHOULDER - 2+ VIEW  COMPARISON:  None.  FINDINGS: Three views of left shoulder submitted. No acute fracture or subluxation. No radiopaque foreign body.  IMPRESSION: Negative.   Electronically Signed   By: Natasha MeadLiviu  Pop M.D.   On: 11/17/2014 13:26      EKG Interpretation   Date/Time:  Tuesday November 17 2014 11:55:57 EDT Ventricular Rate:  58 PR Interval:  144 QRS Duration: 80 QT Interval:  393 QTC Calculation: 386 R Axis:   44 Text Interpretation:  Sinus rhythm Minimal ST elevation, inferior leads No  significant change since last tracing Confirmed by WARD,  DO, KRISTEN  [phone]([id number]) on 11/17/2014 12:11:13 PM      MDM   Final diagnoses:  Fall    Patient with suspected mechanical fall from a ladder when he missed a step. Questionable loss of consciousness, however vital signs, laboratory workup, and imaging studies are unremarkable today. Patient feels well. He is not in a apparent distress. He is able to ambulate. His pain is controlled. He is stable and ready for discharge.  Patient discussed with Dr. Elesa MassedWard, who agrees with the plan.    Roxy Horsemanobert Maddyx Vallie, PA-C 11/17/14 1500  Kristen N Ward, DO 11/17/14 1506

## 2014-11-17 NOTE — ED Notes (Signed)
Pt reports he has not checked his blood sugar in several weeks because he is out of strips. Pt CBG in ED was 133. Pt also reports he did not take any of his meds this AM .

## 2014-11-17 NOTE — ED Notes (Signed)
Ambulated Patient, around Nursing Station .  Patient stated he felt fine, just his leg and his arm was sore.

## 2014-11-17 NOTE — ED Notes (Signed)
Pt does not remember the fall from ladder. Pt states he was on a 10FT ladder and fell on to concrete . Pt reports to be a diabetic and HTN. Pt has not taken meds this AM.

## 2016-10-18 ENCOUNTER — Emergency Department (HOSPITAL_COMMUNITY): Payer: BLUE CROSS/BLUE SHIELD

## 2016-10-18 ENCOUNTER — Emergency Department (HOSPITAL_COMMUNITY)
Admission: EM | Admit: 2016-10-18 | Discharge: 2016-10-19 | Disposition: A | Discharge status: Home / Self Care | Payer: BLUE CROSS/BLUE SHIELD | Attending: Emergency Medicine | Admitting: Emergency Medicine

## 2016-10-18 ENCOUNTER — Encounter (HOSPITAL_COMMUNITY): Payer: Self-pay | Admitting: *Deleted

## 2016-10-18 DIAGNOSIS — Z7984 Long term (current) use of oral hypoglycemic drugs: Secondary | ICD-10-CM | POA: Diagnosis not present

## 2016-10-18 DIAGNOSIS — E119 Type 2 diabetes mellitus without complications: Secondary | ICD-10-CM | POA: Diagnosis not present

## 2016-10-18 DIAGNOSIS — R002 Palpitations: Secondary | ICD-10-CM | POA: Diagnosis not present

## 2016-10-18 DIAGNOSIS — I10 Essential (primary) hypertension: Secondary | ICD-10-CM | POA: Diagnosis not present

## 2016-10-18 LAB — CBC
HEMATOCRIT: 38.3 % — AB (ref 39.0–52.0)
HEMOGLOBIN: 12.7 g/dL — AB (ref 13.0–17.0)
MCH: 29.1 pg (ref 26.0–34.0)
MCHC: 33.2 g/dL (ref 30.0–36.0)
MCV: 87.8 fL (ref 78.0–100.0)
Platelets: 288 10*3/uL (ref 150–400)
RBC: 4.36 MIL/uL (ref 4.22–5.81)
RDW: 13.2 % (ref 11.5–15.5)
WBC: 5.4 10*3/uL (ref 4.0–10.5)

## 2016-10-18 LAB — TROPONIN I

## 2016-10-18 LAB — BASIC METABOLIC PANEL
ANION GAP: 9 (ref 5–15)
BUN: 15 mg/dL (ref 6–20)
CALCIUM: 9.5 mg/dL (ref 8.9–10.3)
CO2: 26 mmol/L (ref 22–32)
Chloride: 103 mmol/L (ref 101–111)
Creatinine, Ser: 1.06 mg/dL (ref 0.61–1.24)
GFR calc Af Amer: >60 mL/min (ref >60)
GFR calc non Af Amer: >60 mL/min (ref >60)
Glucose, Bld: 94 mg/dL (ref 65–99)
POTASSIUM: 3.9 mmol/L (ref 3.5–5.1)
Sodium: 138 mmol/L (ref 135–145)

## 2016-10-18 NOTE — ED Triage Notes (Signed)
The  Pt is c/o lt upper chest pain since this am  With some sob.  He has a history of the same no cardiac history

## 2016-10-18 NOTE — ED Provider Notes (Signed)
[facility]-EMERGENCY DEPT Provider Note   CSN: [id number] Arrival date & time: 10/18/16  1923     History   Chief Complaint Chief Complaint  Patient presents with  . Chest Pain    HPI Timothy Friedman is a 46 y.o. male.  HPI Timothy Friedman is a 46 y.o. male with history of diabetes and hypertension, presents to emergency department complaining of palpitations. Patient states he was driving earlier today, and states he started feeling like his heart was skipping a beat. He states this lasted just a few seconds. He reports associated shortness of breath the last a few minutes. He denies any nausea, dizziness, diaphoresis. He reports prior similar episodes in the last week. He states it has happened approximately 10 times in the last week. He denies any chest pain but states he had some discomfort in the left chest that was associated with the palpitations and that radiate into the left shoulder. He states that he does not have a known coronary artery disease and reports having a stress test "many many years ago." He does not see a cardiologist. He reports family history of heart attack in his father at age 15. He is not a smoker. He denies any symptoms at this time. He drinks only occasional alcohol. Denies any drugs. He reports heavy use of caffeinated drinks. Denies any other complaints.  Past Medical History:  Diagnosis Date  . Diabetes mellitus   . Hypertension     Patient Active Problem List   Diagnosis Date Noted  . Obesity (BMI 30-39.9) 03/16/2014  . Elevated LFTs 01/29/2013  . Diabetes mellitus type 2, uncontrolled (HCC) 12/11/2012  . Chronic constipation 12/11/2012  . Other and unspecified hyperlipidemia 01/02/2012  . Hypertension 01/02/2012  . Contraception 12/08/2011    Past Surgical History:  Procedure Laterality Date  . LACERATION REPAIR     right forearm       Home Medications    Prior to Admission medications   Medication Sig Start Date End Date Taking?  Authorizing Provider  FLUoxetine (PROZAC) 20 MG capsule Take 20 mg by mouth daily.    Historical Provider, MD  glipiZIDE (GLIPIZIDE XL) 5 MG 24 hr tablet Take 1 tablet (5 mg total) by mouth daily with breakfast. 03/16/14   Shelia Media, MD  HYDROcodone-acetaminophen (NORCO/VICODIN) 5-325 MG per tablet Take 1-2 tablets by mouth every 6 (six) hours as needed. 11/17/14   Roxy Horseman, PA-C  Linaclotide [facility]) 145 MCG CAPS Take 1 capsule (145 mcg total) by mouth daily. Patient not taking: Reported on 11/17/2014 12/11/12   Shelia Media, MD  lisinopril (PRINIVIL,ZESTRIL) 20 MG tablet Take 20 mg by mouth daily. 10/23/14   Historical Provider, MD  lisinopril (PRINIVIL,ZESTRIL) 5 MG tablet Take 1 tablet (5 mg total) by mouth daily. Patient not taking: Reported on 11/17/2014 03/16/14 03/16/15  Shelia Media, MD  metFORMIN (GLUCOPHAGE) 1000 MG tablet Take 1 tablet (1,000 mg total) by mouth 2 (two) times daily with a meal. 03/16/14   Shelia Media, MD    Family History Family History  Problem Relation Age of Onset  . Diabetes Mother   . Heart disease Father 14  . Diabetes Brother     Social History Social History  Substance Use Topics  . Smoking status: Never Smoker  . Smokeless tobacco: Never Used  . Alcohol use No     Allergies   Iodinated diagnostic agents and Shellfish allergy   Review of Systems Review of Systems  Constitutional:  Negative for chills and fever.  Respiratory: Positive for chest tightness and shortness of breath. Negative for cough.   Cardiovascular: Positive for palpitations. Negative for chest pain and leg swelling.  Gastrointestinal: Negative for abdominal distention, abdominal pain, diarrhea, nausea and vomiting.  Musculoskeletal: Negative for arthralgias, myalgias, neck pain and neck stiffness.  Skin: Negative for rash.  Allergic/Immunologic: Negative for immunocompromised state.  Neurological: Negative for dizziness, weakness, light-headedness, numbness  and headaches.  All other systems reviewed and are negative.    Physical Exam Updated Vital Signs BP 125/92 (BP Location: Left Arm)   Pulse 67   Resp 18   Ht 5\' 6"  (1.676 m)   Wt 95.4 kg   SpO2 98%   BMI 33.95 kg/m   Physical Exam  Constitutional: He appears well-developed and well-nourished. No distress.  HENT:  Head: Normocephalic and atraumatic.  Eyes: Conjunctivae are normal.  Neck: Neck supple.  Cardiovascular: Normal rate, regular rhythm and normal heart sounds.   Pulmonary/Chest: Effort normal. No respiratory distress. He has no wheezes. He has no rales.  Abdominal: Soft. Bowel sounds are normal. He exhibits no distension. There is no tenderness. There is no rebound.  Musculoskeletal: He exhibits no edema.  Neurological: He is alert.  Skin: Skin is warm and dry.  Nursing note and vitals reviewed.    ED Treatments / Results  Labs (all labs ordered are listed, but only abnormal results are displayed) Labs Reviewed  CBC - Abnormal; Notable for the following:       Result Value   Hemoglobin 12.7 (*)    HCT 38.3 (*)    All other components within normal limits  BASIC METABOLIC PANEL  TROPONIN I  TROPONIN I    EKG  EKG Interpretation  Date/Time:  Wednesday October 18 2016 19:32:20 EST Ventricular Rate:  73 PR Interval:  158 QRS Duration: 82 QT Interval:  372 QTC Calculation: 409 R Axis:   67 Text Interpretation:  Normal sinus rhythm with sinus arrhythmia Abnormal QRS-T angle, consider primary T wave abnormality Abnormal ECG No significant change since last tracing in March 2016 Confirmed by [place],  DO, KRISTEN [phone]) on 10/18/2016 11:15:17 PM       Radiology Dg Chest 2 View  Result Date: 10/18/2016 CLINICAL DATA:  Central chest pain with shortness of breath EXAM: CHEST  2 VIEW COMPARISON:  11/02/2014 FINDINGS: The heart size and mediastinal contours are within normal limits. Both lungs are clear. The visualized skeletal structures are unremarkable.  IMPRESSION: No active cardiopulmonary disease. Electronically Signed   By: Jasmine Pang M.D.   On: 10/18/2016 20:34    Procedures Procedures (including critical care time)  Medications Ordered in ED Medications - No data to display   Initial Impression / Assessment and Plan / ED Course  I have reviewed the triage vital signs and the nursing notes.  Pertinent labs & imaging results that were available during my care of the patient were reviewed by me and considered in my medical decision making (see chart for details).     Patient in emergency department with palpitations, no chest pain, but did report some discomfort which started around 6 PM tonight. Currently asymptomatic. Initial troponin negative. CBC and metabolic panel are within normal. Chest x-ray negative. Patient is in no acute distress. EKG showing nonspecific T-wave inversions, unchanged from prior. Patient has a heart score of 3. We'll repeat troponin. Will monitory on cardiac monitor while in ED  1:50 AM Delta trop negative. Pt continues to be symptom  free. Low risk with atypical symptoms. Plan to dc home and follow up with pcp or cardiology. Return precautions discussed.   Vitals:   10/18/16 2330 10/19/16 0000 10/19/16 0030 10/19/16 0100  BP: 124/91 115/89 123/88 127/97  Pulse: 69 70 63 (!) 58  Resp: 12 22 16 12   SpO2: 100% 100% 99% 99%  Weight:      Height:         Final Clinical Impressions(s) / ED Diagnoses   Final diagnoses:  Palpitations    New Prescriptions New Prescriptions   No medications on file     Jaynie Crumbleatyana Cleveland Yarbro, PA-C 10/19/16 0151    Layla MawKristen N Ward, DO 10/19/16 [phone]

## 2016-10-19 LAB — TROPONIN I

## 2016-10-19 NOTE — Discharge Instructions (Signed)
Please follow up with cardiology for further evaluation. Avoid caffeinated beverages, alcohol, unprescribed supplements or medications. Return if worsening symptoms.

## 2018-01-08 ENCOUNTER — Emergency Department (HOSPITAL_COMMUNITY)
Admission: EM | Admit: 2018-01-08 | Discharge: 2018-01-09 | Disposition: A | Discharge status: Home / Self Care | Payer: 59 | Attending: Emergency Medicine | Admitting: Emergency Medicine

## 2018-01-08 ENCOUNTER — Encounter (HOSPITAL_COMMUNITY): Payer: Self-pay | Admitting: Emergency Medicine

## 2018-01-08 DIAGNOSIS — Z7984 Long term (current) use of oral hypoglycemic drugs: Secondary | ICD-10-CM | POA: Diagnosis not present

## 2018-01-08 DIAGNOSIS — L03115 Cellulitis of right lower limb: Secondary | ICD-10-CM | POA: Diagnosis not present

## 2018-01-08 DIAGNOSIS — M7989 Other specified soft tissue disorders: Secondary | ICD-10-CM

## 2018-01-08 DIAGNOSIS — R739 Hyperglycemia, unspecified: Secondary | ICD-10-CM

## 2018-01-08 DIAGNOSIS — I10 Essential (primary) hypertension: Secondary | ICD-10-CM | POA: Insufficient documentation

## 2018-01-08 DIAGNOSIS — Z79899 Other long term (current) drug therapy: Secondary | ICD-10-CM | POA: Insufficient documentation

## 2018-01-08 DIAGNOSIS — E1165 Type 2 diabetes mellitus with hyperglycemia: Secondary | ICD-10-CM | POA: Diagnosis not present

## 2018-01-08 DIAGNOSIS — R600 Localized edema: Secondary | ICD-10-CM | POA: Diagnosis present

## 2018-01-08 LAB — CBC WITH DIFFERENTIAL/PLATELET
ABS IMMATURE GRANULOCYTES: 0 10*3/uL (ref 0.0–0.1)
Basophils Absolute: 0 10*3/uL (ref 0.0–0.1)
Basophils Relative: 0 %
EOS PCT: 2 %
Eosinophils Absolute: 0.1 10*3/uL (ref 0.0–0.7)
HEMATOCRIT: 41 % (ref 39.0–52.0)
Hemoglobin: 13.4 g/dL (ref 13.0–17.0)
Immature Granulocytes: 1 %
LYMPHS ABS: 1.3 10*3/uL (ref 0.7–4.0)
LYMPHS PCT: 21 %
MCH: 28.1 pg (ref 26.0–34.0)
MCHC: 32.7 g/dL (ref 30.0–36.0)
MCV: 86 fL (ref 78.0–100.0)
Monocytes Absolute: 0.6 10*3/uL (ref 0.1–1.0)
Monocytes Relative: 9 %
NEUTROS PCT: 68 %
Neutro Abs: 4.3 10*3/uL (ref 1.7–7.7)
Platelets: 275 10*3/uL (ref 150–400)
RBC: 4.77 MIL/uL (ref 4.22–5.81)
RDW: 12.3 % (ref 11.5–15.5)
WBC: 6.3 10*3/uL (ref 4.0–10.5)

## 2018-01-08 LAB — BASIC METABOLIC PANEL
ANION GAP: 12 (ref 5–15)
BUN: 18 mg/dL (ref 6–20)
CO2: 28 mmol/L (ref 22–32)
Calcium: 8.9 mg/dL (ref 8.9–10.3)
Chloride: 92 mmol/L — ABNORMAL LOW (ref 101–111)
Creatinine, Ser: 1.33 mg/dL — ABNORMAL HIGH (ref 0.61–1.24)
GFR calc non Af Amer: >60 mL/min (ref >60)
Glucose, Bld: 335 mg/dL — ABNORMAL HIGH (ref 65–99)
Potassium: 4.1 mmol/L (ref 3.5–5.1)
Sodium: 132 mmol/L — ABNORMAL LOW (ref 135–145)

## 2018-01-08 LAB — D-DIMER, QUANTITATIVE: D-Dimer, Quant: 1.07 ug/mL-FEU — ABNORMAL HIGH (ref 0.00–0.50)

## 2018-01-08 MED ORDER — ACETAMINOPHEN 325 MG PO TABS
650.0000 mg | ORAL_TABLET | Freq: Once | ORAL | Status: AC | PRN
  Administered 2018-01-08: 650 mg via ORAL
  Filled 2018-01-08: qty 2

## 2018-01-08 MED ORDER — DIPHENHYDRAMINE HCL 25 MG PO CAPS: 50.0000 mg | ORAL_CAPSULE | Freq: Once | ORAL | Status: DC

## 2018-01-08 MED ORDER — HYDROCORTISONE NA SUCCINATE PF 250 MG IJ SOLR: 200.0000 mg | Freq: Once | INTRAMUSCULAR | Status: DC

## 2018-01-08 MED ORDER — SODIUM CHLORIDE 0.9 % IV BOLUS
1000.0000 mL | Freq: Once | INTRAVENOUS | Status: AC
  Administered 2018-01-08: 1000 mL via INTRAVENOUS

## 2018-01-08 MED ORDER — DIPHENHYDRAMINE HCL 50 MG/ML IJ SOLN: 50.0000 mg | Freq: Once | INTRAMUSCULAR | Status: DC

## 2018-01-08 NOTE — ED Provider Notes (Signed)
[facility] EMERGENCY DEPARTMENT Provider Note   CSN: [id number] Arrival date & time: 01/08/18  1924     History   Chief Complaint Chief Complaint  Patient presents with  . Leg Pain  . Fever    HPI CHRSITOPHER WIK is a 47 y.o. male.  47 year old male with a history of hypertension, diabetes, dyslipidemia presents to the emergency department for worsening pain and swelling in his right lower extremity.  Symptoms began on Saturday.  They have been constant and progressively worsening.  He notes some tenderness to his posterior calf which is aggravated with ambulation.  He also had some discomfort in his proximal right groin associated with a "lump" which has since resolved.  He reports a sensation of "feeling bad".  He does endorse a fever of approximately 101F prior to arrival.  He has been self managing his symptoms with Tylenol which he last took at 1300 today; additional dose given in triage.  He has no personal history of DVT or PE.  No recent surgeries, trauma, hospitalization, prolonged travel.  He works from home.  He denies any known tick bites.  Triage note references shortness of breath.  Patient states this is mild.  He feels it mostly when increasingly conversant, but will also feels slightly short of breath with prolonged ambulation.  He has not experienced chest pain, syncope, N/V.     Past Medical History:  Diagnosis Date  . Diabetes mellitus   . Hypertension     Patient Active Problem List   Diagnosis Date Noted  . Obesity (BMI 30-39.9) 03/16/2014  . Elevated LFTs 01/29/2013  . Diabetes mellitus type 2, uncontrolled (HCC) 12/11/2012  . Chronic constipation 12/11/2012  . Other and unspecified hyperlipidemia 01/02/2012  . Hypertension 01/02/2012  . Contraception 12/08/2011    Past Surgical History:  Procedure Laterality Date  . LACERATION REPAIR     right forearm        Home Medications    Prior to Admission medications     Medication Sig Start Date End Date Taking? Authorizing Provider  acetaminophen (TYLENOL) 500 MG tablet Take 1,000 mg by mouth every 6 (six) hours as needed (for headaches).   Yes [provider]  FLUoxetine (PROZAC) 20 MG capsule Take 20 mg by mouth daily.   Yes [provider]  glipiZIDE (GLUCOTROL XL) 10 MG 24 hr tablet Take 20 mg by mouth daily before breakfast. 01/04/18  Yes [provider]  hydrochlorothiazide (HYDRODIURIL) 25 MG tablet Take 25 mg by mouth daily. 12/24/17  Yes [provider]  metFORMIN (GLUCOPHAGE-XR) 500 MG 24 hr tablet Take 1,000 mg by mouth 2 (two) times daily with a meal.   Yes [provider]  valsartan (DIOVAN) 80 MG tablet Take 80 mg by mouth daily.   Yes [provider]  glipiZIDE (GLIPIZIDE XL) 5 MG 24 hr tablet Take 1 tablet (5 mg total) by mouth daily with breakfast. Patient not taking: Reported on 01/08/2018 03/16/14   Shelia Media, MD  HYDROcodone-acetaminophen (NORCO/VICODIN) 5-325 MG per tablet Take 1-2 tablets by mouth every 6 (six) hours as needed. Patient not taking: Reported on 01/08/2018 11/17/14   Roxy Horseman, PA-C  Linaclotide [facility]) 145 MCG CAPS Take 1 capsule (145 mcg total) by mouth daily. Patient not taking: Reported on 01/08/2018 12/11/12   Shelia Media, MD  lisinopril (PRINIVIL,ZESTRIL) 5 MG tablet Take 1 tablet (5 mg total) by mouth daily. Patient not taking: Reported on 01/08/2018 03/16/14 01/08/18  Shelia Media, MD  metFORMIN (GLUCOPHAGE) 1000 MG tablet Take 1 tablet (1,000 mg total) by mouth 2 (two) times daily with a meal. Patient not taking: Reported on 01/08/2018 03/16/14   Shelia Media, MD    Family History Family History  Problem Relation Age of Onset  . Diabetes Mother   . Heart disease Father 62  . Diabetes Brother     Social History Social History   Tobacco Use  . Smoking status: Never Smoker  . Smokeless tobacco: Never Used  Substance Use Topics  .  Alcohol use: No  . Drug use: No     Allergies   Iodinated diagnostic agents; Iodine; Shellfish allergy; and Linzess [linaclotide]   Review of Systems Review of Systems Ten systems reviewed and are negative for acute change, except as noted in the HPI.    Physical Exam Updated Vital Signs BP 122/77   Pulse 94   Temp 99.8 F (37.7 C) (Oral)   Resp (!) 24   SpO2 97%   Physical Exam  Constitutional: He is oriented to person, place, and time. He appears well-developed and well-nourished. No distress.  Nontoxic appearing and in NAD  HENT:  Head: Normocephalic and atraumatic.  Eyes: Conjunctivae and EOM are normal. No scleral icterus.  Neck: Normal range of motion.  Cardiovascular: Regular rhythm and intact distal pulses.  Tachycardia. DP pulse 2+ in the RLE.  Pulmonary/Chest: Effort normal. No stridor. No respiratory distress.  Respirations even and unlabored  Abdominal: He exhibits no distension.  Musculoskeletal: Normal range of motion. He exhibits edema (RLE, non-pitting).  Neurological: He is alert and oriented to person, place, and time. He exhibits normal muscle tone. Coordination normal.  Skin: Skin is warm and dry. No rash noted. He is not diaphoretic. There is erythema. No pallor.  Blanching erythema to the RLE from ankle extending to knee. Warmth to touch compared to LLE. No lymphadenic streaking. Edema is non-pitting.  Psychiatric: He has a normal mood and affect. His behavior is normal.  Nursing note and vitals reviewed.    ^RLE swelling and erythema as compared to LLE   ED Treatments / Results  Labs (all labs ordered are listed, but only abnormal results are displayed) Labs Reviewed  BASIC METABOLIC PANEL - Abnormal; Notable for the following components:      Result Value   Sodium 132 (*)    Chloride 92 (*)    Glucose, Bld 335 (*)    Creatinine, Ser 1.33 (*)    All other components within normal limits  D-DIMER, QUANTITATIVE (NOT AT [facility]) - Abnormal;  Notable for the following components:   D-Dimer, Quant 1.07 (*)    All other components within normal limits  CBC WITH DIFFERENTIAL/PLATELET    EKG None  Radiology Nm Pulmonary Vent And Perf (v/q Scan)  Result Date: 01/09/2018 CLINICAL DATA:  47 y/o M; shortness of breath and right lower leg swelling. EXAM: NUCLEAR MEDICINE VENTILATION - PERFUSION LUNG SCAN TECHNIQUE: Ventilation images were obtained in multiple projections using inhaled aerosol Tc-1m DTPA. Perfusion images were obtained in multiple projections after intravenous injection of Tc-72m-MAA. RADIOPHARMACEUTICALS:  32.3 mCi of Tc-66m DTPA aerosol inhalation and 4.3 mCi Tc99m-MAA IV COMPARISON:  None. FINDINGS: Ventilation: No focal ventilation defect. Perfusion: No wedge shaped peripheral perfusion defects to suggest acute pulmonary embolism. IMPRESSION: No mismatch perfusion defect to suggest pulmonary embolus. Electronically Signed   By: Mitzi Hansen M.D.   On: 01/09/2018 04:36    Procedures Procedures (including critical care  time)  Medications Ordered in ED Medications  acetaminophen (TYLENOL) tablet 650 mg (650 mg Oral Given 01/08/18 2025)  sodium chloride 0.9 % bolus 1,000 mL (0 mLs Intravenous Stopped 01/08/18 2344)  technetium TC 57M diethylenetriame-pentaacetic acid (DTPA) injection 30 millicurie (30 millicuries Inhalation Given 01/09/18 0300)  technetium albumin aggregated (MAA) injection solution 4 millicurie (4 millicuries Intravenous Contrast Given 01/09/18 0412)     Initial Impression / Assessment and Plan / ED Course  I have reviewed the triage vital signs and the nursing notes.  Pertinent labs & imaging results that were available during my care of the patient were reviewed by me and considered in my medical decision making (see chart for details).     47 year old male presents to the emergency department for evaluation of right lower extremity swelling.  He has erythema which is blanching in  addition to nonpitting edema.  This is associated with tenderness on palpation to the calf.  Patient reporting associated shortness of breath which is mild.  He was found to have a positive d-dimer and went for a VQ scan which does not show concern for pulmonary embolus.  Differential of right lower extremity swelling includes cellulitis versus DVT.  Given that VQ scan was not resulted until 5 AM, will hold in the department for venous duplex rather than discharge only to have the patient return at 8 AM.  Will hold Lovenox while pending additional imaging.  His clinical picture favors more toward cellulitis as he reports outpatient fever.  He has been afebrile while in the emergency department.  He does not have a leukocytosis today, but would require treatment with outpatient antibiotic course if venous duplex negative.  Patient also will require a refill of his glipizide as he states he ran out of this medication.  He was hydrated in the emergency department to temper his hyperglycemia.  No concern for DKA.  Patient signed out to Glenford Bayley, PA-C at change of shift who will follow up on imaging and disposition appropriately.   Final Clinical Impressions(s) / ED Diagnoses   Final diagnoses:  Swelling of right lower extremity    ED Discharge Orders    None       Antony Madura, PA-C 01/09/18 1610    Tegeler, Canary Brim, MD 01/09/18 [phone]

## 2018-01-08 NOTE — ED Triage Notes (Addendum)
Patient to ED c/o R lower leg pain since last night - swollen and hot to touch, pt reports painful to move and touch. Patient also endorses generalized weakness and fevers/chills x 1 week, denies N/V/D, no urinary symptoms, no sore throat or cough. Patient adds that he's felt short of breath onset today.

## 2018-01-08 NOTE — ED Provider Notes (Signed)
Patient placed in Quick Look pathway, seen and evaluated   Chief Complaint: right lower leg pain and swelling, SOB  HPI:   Patient with past medical history of hypertension, diabetes, hyperlipidemia, presenting to the ED with complaint of progressively worsening pain and swelling to right lower leg that began on Saturday.  He states pain is been progressively worsening, is worse with any ambulation.  Also states he feels some pain up in his right groin.  States today he began feeling short of breath.  No history of DVT or PE, not on anticoagulation, no surgery or trauma, no prolonged travel.  Does state he works from home.  ROS: Lateral leg swelling, shortness of breath  Physical Exam:   Gen: No distress  Neuro: Awake and Alert  Skin: Warm    Focused Exam: Pt is tachycardic, lungs clear to auscultation bilaterally. Right lower leg with significant edema.  Some areas of erythema.  Positive Homans sign and TTP to calf.   Concern for DVT and PE given exam findings.  Venous U/S not available at this time.  Patient has anaphylactic allergy to iodine, though has never received contrast dye.  Discussed this with radiology, who recommends patient does require 4-hour protocol prior to imaging vs VQ scan.  Discussed with RN that patient needs to be roomed soon as a room is available.  Patient monitored in the hallway in triage until that time.  Initiation of care has begun. The patient has been counseled on the process, plan, and necessity for staying for the completion/evaluation, and the remainder of the medical screening examination    Allice Garro, [place] N, PA-C 01/08/18 2039    Gwyneth Sprout, MD 01/08/18 2255

## 2018-01-09 ENCOUNTER — Encounter (HOSPITAL_COMMUNITY): Payer: 59

## 2018-01-09 ENCOUNTER — Emergency Department (HOSPITAL_COMMUNITY): Payer: 59

## 2018-01-09 ENCOUNTER — Emergency Department (HOSPITAL_BASED_OUTPATIENT_CLINIC_OR_DEPARTMENT_OTHER)
Admit: 2018-01-09 | Discharge: 2018-01-09 | Disposition: A | Discharge status: Home / Self Care | Payer: 59 | Attending: Emergency Medicine | Admitting: Emergency Medicine

## 2018-01-09 DIAGNOSIS — M7989 Other specified soft tissue disorders: Secondary | ICD-10-CM | POA: Diagnosis not present

## 2018-01-09 DIAGNOSIS — M79609 Pain in unspecified limb: Secondary | ICD-10-CM

## 2018-01-09 MED ORDER — SULFAMETHOXAZOLE-TRIMETHOPRIM 800-160 MG PO TABS
1.0000 | ORAL_TABLET | Freq: Once | ORAL | Status: AC
  Administered 2018-01-09: 1 via ORAL
  Filled 2018-01-09: qty 1

## 2018-01-09 MED ORDER — CEPHALEXIN 500 MG PO CAPS: 500.0000 mg | ORAL_CAPSULE | Freq: Four times a day (QID) | ORAL | 0 refills | Status: DC

## 2018-01-09 MED ORDER — ACETAMINOPHEN 500 MG PO TABS: 1000.0000 mg | ORAL_TABLET | Freq: Four times a day (QID) | ORAL | Status: DC | PRN

## 2018-01-09 MED ORDER — GLIPIZIDE ER 10 MG PO TB24: 20.0000 mg | ORAL_TABLET | Freq: Every day | ORAL | 0 refills | Status: AC

## 2018-01-09 MED ORDER — GLIPIZIDE ER 10 MG PO TB24
20.0000 mg | ORAL_TABLET | Freq: Every day | ORAL | Status: DC
  Filled 2018-01-09: qty 2

## 2018-01-09 MED ORDER — IRBESARTAN 75 MG PO TABS
75.0000 mg | ORAL_TABLET | Freq: Every day | ORAL | Status: DC
  Filled 2018-01-09: qty 1

## 2018-01-09 MED ORDER — FLUOXETINE HCL 20 MG PO CAPS
20.0000 mg | ORAL_CAPSULE | Freq: Every day | ORAL | Status: DC
  Filled 2018-01-09: qty 1

## 2018-01-09 MED ORDER — CEPHALEXIN 250 MG PO CAPS
500.0000 mg | ORAL_CAPSULE | Freq: Once | ORAL | Status: AC
  Administered 2018-01-09: 500 mg via ORAL
  Filled 2018-01-09: qty 2

## 2018-01-09 MED ORDER — SULFAMETHOXAZOLE-TRIMETHOPRIM 800-160 MG PO TABS: 1.0000 | ORAL_TABLET | Freq: Two times a day (BID) | ORAL | 0 refills | Status: AC

## 2018-01-09 MED ORDER — HYDROCHLOROTHIAZIDE 25 MG PO TABS: 25.0000 mg | ORAL_TABLET | Freq: Every day | ORAL | Status: DC

## 2018-01-09 MED ORDER — TECHNETIUM TC 99M DIETHYLENETRIAME-PENTAACETIC ACID
30.0000 | Freq: Once | INTRAVENOUS | Status: AC | PRN
  Administered 2018-01-09: 30 via RESPIRATORY_TRACT

## 2018-01-09 MED ORDER — HYDROCORTISONE NA SUCCINATE PF 100 MG IJ SOLR: 100.0000 mg | INTRAMUSCULAR | Status: DC

## 2018-01-09 MED ORDER — TECHNETIUM TO 99M ALBUMIN AGGREGATED
4.0000 | Freq: Once | INTRAVENOUS | Status: AC | PRN
  Administered 2018-01-09: 4 via INTRAVENOUS

## 2018-01-09 MED ORDER — METFORMIN HCL ER 500 MG PO TB24
1000.0000 mg | ORAL_TABLET | Freq: Two times a day (BID) | ORAL | Status: DC
  Administered 2018-01-09: 1000 mg via ORAL
  Filled 2018-01-09 (×2): qty 2

## 2018-01-09 NOTE — ED Notes (Signed)
PA at bedside.

## 2018-01-09 NOTE — ED Notes (Signed)
Patient still in scan.  

## 2018-01-09 NOTE — ED Provider Notes (Signed)
Signout from [employer], PA-C at shift change See previous provider's note for full H&P  Briefly, patient with RLE swelling and erythema, worse with ambulation, calf tenderness since Saturday. He has had some shortness of breath. Unclear about fever, reported 101 at home, none here.  Plan: If [place] negative, abx for cellulitis. If positive, treat with anticoagulation  On my exam, patient has erythema from the right knee to right ankle with warmth and tenderness.  DVT ultrasound is negative in the right lower extremity.  Will treat for cellulitis with Bactrim and Keflex.  Patient advised to see his PCP in 2 days for wound check and return sooner if spreading.  First doses of antibiotics given in the ED.  Patient will also be given prescription for glipizide, written by previous provider.  Patient understands and agrees with plan.  Patient vitals stable throughout ED course and discharged in satisfactory condition.     Emi Holes, PA-C 01/09/18 2130    Tilden Fossa, MD 01/10/18 1017

## 2018-01-09 NOTE — ED Notes (Signed)
Going to [facility].

## 2018-01-09 NOTE — ED Notes (Signed)
Patient in scan...  

## 2018-01-09 NOTE — ED Notes (Signed)
Patient aware he is waiting on U/S of his leg

## 2018-01-09 NOTE — ED Notes (Signed)
Transported to u/s

## 2018-01-09 NOTE — Progress Notes (Signed)
Preliminary results by tech - Venous Duplex Lower Ext. Completed. Negative for deep vein thrombosis. Zareena Willis, BS, RDMS, RVT  

## 2018-01-09 NOTE — Discharge Instructions (Addendum)
Begin taking Bactrim and Keflex and take until completed unless told otherwise by your doctor.  Please see your doctor in 2 days for recheck.  Please return to the emergency department if you develop any significant spreading of redness past the marked line, or develop any new or worsening symptoms.

## 2018-09-12 ENCOUNTER — Other Ambulatory Visit: Payer: Self-pay

## 2018-09-12 ENCOUNTER — Emergency Department: Payer: Self-pay

## 2018-09-12 ENCOUNTER — Emergency Department
Admission: EM | Admit: 2018-09-12 | Discharge: 2018-09-12 | Disposition: A | Discharge status: Home / Self Care | Payer: Self-pay | Attending: Emergency Medicine | Admitting: Emergency Medicine

## 2018-09-12 ENCOUNTER — Encounter: Payer: Self-pay | Admitting: Intensive Care

## 2018-09-12 DIAGNOSIS — Z79899 Other long term (current) drug therapy: Secondary | ICD-10-CM | POA: Insufficient documentation

## 2018-09-12 DIAGNOSIS — R739 Hyperglycemia, unspecified: Secondary | ICD-10-CM

## 2018-09-12 DIAGNOSIS — E1165 Type 2 diabetes mellitus with hyperglycemia: Secondary | ICD-10-CM | POA: Insufficient documentation

## 2018-09-12 DIAGNOSIS — Z7984 Long term (current) use of oral hypoglycemic drugs: Secondary | ICD-10-CM | POA: Insufficient documentation

## 2018-09-12 DIAGNOSIS — I10 Essential (primary) hypertension: Secondary | ICD-10-CM | POA: Insufficient documentation

## 2018-09-12 DIAGNOSIS — R0789 Other chest pain: Secondary | ICD-10-CM | POA: Insufficient documentation

## 2018-09-12 HISTORY — DX: Sleep apnea, unspecified: G47.30

## 2018-09-12 HISTORY — DX: Narcolepsy without cataplexy: G47.419

## 2018-09-12 LAB — TROPONIN I
Troponin I: <0.03 ng/mL (ref <0.03)
Troponin I: <0.03 ng/mL (ref <0.03)

## 2018-09-12 LAB — CBC
HCT: 42.9 % (ref 39.0–52.0)
HEMOGLOBIN: 14.5 g/dL (ref 13.0–17.0)
MCH: 29.3 pg (ref 26.0–34.0)
MCHC: 33.8 g/dL (ref 30.0–36.0)
MCV: 86.7 fL (ref 80.0–100.0)
Platelets: 335 10*3/uL (ref 150–400)
RBC: 4.95 MIL/uL (ref 4.22–5.81)
RDW: 11.8 % (ref 11.5–15.5)
WBC: 4.5 10*3/uL (ref 4.0–10.5)
nRBC: 0 % (ref 0.0–0.2)

## 2018-09-12 LAB — BASIC METABOLIC PANEL
ANION GAP: 9 (ref 5–15)
BUN: 20 mg/dL (ref 6–20)
CO2: 26 mmol/L (ref 22–32)
Calcium: 9.4 mg/dL (ref 8.9–10.3)
Chloride: 94 mmol/L — ABNORMAL LOW (ref 98–111)
Creatinine, Ser: 1.21 mg/dL (ref 0.61–1.24)
GFR calc Af Amer: >60 mL/min (ref >60)
GLUCOSE: 463 mg/dL — AB (ref 70–99)
POTASSIUM: 4.3 mmol/L (ref 3.5–5.1)
Sodium: 129 mmol/L — ABNORMAL LOW (ref 135–145)

## 2018-09-12 LAB — URINALYSIS, COMPLETE (UACMP) WITH MICROSCOPIC
BACTERIA UA: NONE SEEN
BILIRUBIN URINE: NEGATIVE
Glucose, UA: >=500 mg/dL — AB
Hgb urine dipstick: NEGATIVE
KETONES UR: 5 mg/dL — AB
LEUKOCYTES UA: NEGATIVE
NITRITE: NEGATIVE
PROTEIN: NEGATIVE mg/dL
SQUAMOUS EPITHELIAL / LPF: NONE SEEN (ref 0–5)
Specific Gravity, Urine: 1.024 (ref 1.005–1.030)
pH: 5 (ref 5.0–8.0)

## 2018-09-12 LAB — GLUCOSE, CAPILLARY
GLUCOSE-CAPILLARY: 438 mg/dL — AB (ref 70–99)
GLUCOSE-CAPILLARY: 401 mg/dL — AB (ref 70–99)
Glucose-Capillary: 453 mg/dL — ABNORMAL HIGH (ref 70–99)
Glucose-Capillary: 324 mg/dL — ABNORMAL HIGH (ref 70–99)

## 2018-09-12 MED ORDER — ASPIRIN 81 MG PO CHEW
324.0000 mg | CHEWABLE_TABLET | Freq: Once | ORAL | Status: AC
Start: 2018-09-12 — End: 2018-09-12
  Administered 2018-09-12: 324 mg via ORAL
  Filled 2018-09-12: qty 4

## 2018-09-12 MED ORDER — PIOGLITAZONE HCL 15 MG PO TABS
15.0000 mg | ORAL_TABLET | ORAL | Status: AC
  Administered 2018-09-12: 15 mg via ORAL
  Filled 2018-09-12: qty 1

## 2018-09-12 MED ORDER — PIOGLITAZONE HCL 15 MG PO TABS: 15.0000 mg | ORAL_TABLET | Freq: Every day | ORAL | 0 refills | Status: AC

## 2018-09-12 MED ORDER — INSULIN ASPART 100 UNIT/ML ~~LOC~~ SOLN
3.0000 [IU] | Freq: Once | SUBCUTANEOUS | Status: AC
  Administered 2018-09-12: 3 [IU] via INTRAVENOUS
  Filled 2018-09-12: qty 1

## 2018-09-12 MED ORDER — METFORMIN HCL ER 500 MG PO TB24: 500.0000 mg | ORAL_TABLET | Freq: Two times a day (BID) | ORAL | 0 refills | Status: AC

## 2018-09-12 MED ORDER — METFORMIN HCL 500 MG PO TABS
500.0000 mg | ORAL_TABLET | Freq: Once | ORAL | Status: AC
  Administered 2018-09-12: 500 mg via ORAL
  Filled 2018-09-12: qty 1

## 2018-09-12 MED ORDER — SODIUM CHLORIDE 0.9 % IV BOLUS
1000.0000 mL | Freq: Once | INTRAVENOUS | Status: AC
Start: 2018-09-12 — End: 2018-09-12
  Administered 2018-09-12: 1000 mL via INTRAVENOUS

## 2018-09-12 NOTE — ED Notes (Signed)
Pt ambulated to toilet with a steady gait. NAD noted. VSS.

## 2018-09-12 NOTE — ED Notes (Signed)
Signature pad not working. Signature DC document printed.

## 2018-09-12 NOTE — ED Triage Notes (Addendum)
Patient c/o chest pain for over a week. Reports it feels like something is "punching" him intermittently that results in SOB and has some radiation to L arm. Patient is diabetic and reports he ran out of medicine X1 week ago

## 2018-09-12 NOTE — ED Notes (Addendum)
Pt states CP started yesterday while sitting at work. Pt states CP on left side of chest that feels "pressure" which radiates down to L/arm. Pt denies dizziness. VSS. NAD noted at this time.

## 2018-09-12 NOTE — ED Provider Notes (Signed)
[facility] Emergency Department Provider Note   ____________________________________________   First MD Initiated Contact with Patient 09/12/18 1245     (approximate)  I have reviewed the triage vital signs and the nursing notes.   HISTORY  Chief Complaint Chest Pain    HPI Timothy Friedman is a 48 y.o. male here for evaluation of chest pain.  Is experienced pain and discomfort for over a week.  Feels like a slight pressure-like feeling that radiates slightly down his left arm over the left upper shoulder region.  He is diabetic as well, reports he ran out of all his medicine about a week ago.  He is currently not in pain.  Reports that it has come and gone twice over the last week or so, nothing seems to obviously make it better or worse.  No nausea or sweats with it.  No abdominal pain.  No vomiting.  No back or neck pain.  Did not radiate to his jaw.  Reports he thinks he had a stress test several years ago and it was normal.  Past Medical History:  Diagnosis Date  . Diabetes mellitus   . Hypertension   . Narcolepsy   . Sleep apnea     Patient Active Problem List   Diagnosis Date Noted  . Obesity (BMI 30-39.9) 03/16/2014  . Elevated LFTs 01/29/2013  . Diabetes mellitus type 2, uncontrolled (HCC) 12/11/2012  . Chronic constipation 12/11/2012  . Other and unspecified hyperlipidemia 01/02/2012  . Hypertension 01/02/2012  . Contraception 12/08/2011    Past Surgical History:  Procedure Laterality Date  . LACERATION REPAIR     right forearm    Prior to Admission medications   Medication Sig Start Date End Date Taking? Authorizing Provider  FLUoxetine (PROZAC) 20 MG capsule Take 20 mg by mouth daily.   Yes [provider]  glipiZIDE (GLUCOTROL XL) 10 MG 24 hr tablet Take 2 tablets (20 mg total) by mouth daily before breakfast. 01/09/18  Yes Antony Madura, PA-C  hydrochlorothiazide (HYDRODIURIL) 25 MG tablet Take 25 mg by mouth daily.  12/24/17  Yes [provider]  simvastatin (ZOCOR) 20 MG tablet Take 20 mg by mouth at bedtime. 01/18/18 01/18/19 Yes [provider]  valsartan (DIOVAN) 80 MG tablet Take 80 mg by mouth daily.   Yes [provider]  acetaminophen (TYLENOL) 500 MG tablet Take 1,000 mg by mouth every 6 (six) hours as needed (for headaches).    [provider]  HYDROcodone-acetaminophen (NORCO/VICODIN) 5-325 MG per tablet Take 1-2 tablets by mouth every 6 (six) hours as needed. Patient not taking: Reported on 01/08/2018 11/17/14   Roxy Horseman, PA-C  Linaclotide [facility]) 145 MCG CAPS Take 1 capsule (145 mcg total) by mouth daily. Patient not taking: Reported on 01/08/2018 12/11/12   Shelia Media, MD  lisinopril (PRINIVIL,ZESTRIL) 5 MG tablet Take 1 tablet (5 mg total) by mouth daily. Patient not taking: Reported on 01/08/2018 03/16/14 01/08/18  Shelia Media, MD  metFORMIN (GLUCOPHAGE-XR) 500 MG 24 hr tablet Take 1 tablet (500 mg total) by mouth 2 (two) times daily with a meal. 09/12/18   Sharyn Creamer, MD  pioglitazone (ACTOS) 15 MG tablet Take 1 tablet (15 mg total) by mouth daily. 09/12/18 09/12/19  Sharyn Creamer, MD    Allergies Iodinated diagnostic agents; Iodine; Shellfish allergy; and Linzess [linaclotide]  Family History  Problem Relation Age of Onset  . Diabetes Mother   . Heart disease Father 3  . Diabetes Brother  Heart disease late in life, father  Social History Social History   Tobacco Use  . Smoking status: Never Smoker  . Smokeless tobacco: Never Used  Substance Use Topics  . Alcohol use: Yes    Alcohol/week: 4.0 standard drinks    Types: 4 Glasses of wine per week    Comment: weekends  . Drug use: No    Review of Systems Constitutional: No fever/chills.  Does report elevated blood sugar Eyes: No visual changes. ENT: No sore throat. Cardiovascular: See HPI Respiratory: Denies shortness of breath. Gastrointestinal: No abdominal pain.     Genitourinary: Negative for dysuria. Musculoskeletal: Negative for back pain. Skin: Negative for rash. Neurological: Negative for headaches, areas of focal weakness or numbness.    ____________________________________________   PHYSICAL EXAM:  VITAL SIGNS: ED Triage Vitals  Enc Vitals Group     BP 09/12/18 0916 (!) 143/90     Pulse Rate 09/12/18 0916 96     Resp 09/12/18 0916 16     Temp 09/12/18 0916 98.2 F (36.8 C)     Temp Source 09/12/18 0916 Oral     SpO2 09/12/18 0916 98 %     Weight 09/12/18 0917 210 lb (95.3 kg)     Height 09/12/18 0917 5\' 6"  (1.676 m)     Head Circumference --      Peak Flow --      Pain Score 09/12/18 0917 6     Pain Loc --      Pain Edu? --      Excl. in GC? --     Constitutional: Alert and oriented. Well appearing and in no acute distress. Eyes: Conjunctivae are normal. Head: Atraumatic. Nose: No congestion/rhinnorhea. Mouth/Throat: Mucous membranes are moist. Neck: No stridor.  Cardiovascular: Normal rate, regular rhythm. Grossly normal heart sounds.  Good peripheral circulation. Respiratory: Normal respiratory effort.  No retractions. Lungs CTAB. Gastrointestinal: Soft and nontender. No distention. Musculoskeletal: No lower extremity tenderness nor edema. Neurologic:  Normal speech and language. No gross focal neurologic deficits are appreciated.  Skin:  Skin is warm, dry and intact. No rash noted. Psychiatric: Mood and affect are normal. Speech and behavior are normal.  ____________________________________________   LABS (all labs ordered are listed, but only abnormal results are displayed)  Labs Reviewed  BASIC METABOLIC PANEL - Abnormal; Notable for the following components:      Result Value   Sodium 129 (*)    Chloride 94 (*)    Glucose, Bld 463 (*)    All other components within normal limits  GLUCOSE, CAPILLARY - Abnormal; Notable for the following components:   Glucose-Capillary 453 (*)    All other components  within normal limits  URINALYSIS, COMPLETE (UACMP) WITH MICROSCOPIC - Abnormal; Notable for the following components:   Color, Urine STRAW (*)    APPearance CLEAR (*)    Glucose, UA >=500 (*)    Ketones, ur 5 (*)    All other components within normal limits  GLUCOSE, CAPILLARY - Abnormal; Notable for the following components:   Glucose-Capillary 438 (*)    All other components within normal limits  GLUCOSE, CAPILLARY - Abnormal; Notable for the following components:   Glucose-Capillary 401 (*)    All other components within normal limits  GLUCOSE, CAPILLARY - Abnormal; Notable for the following components:   Glucose-Capillary 324 (*)    All other components within normal limits  CBC  TROPONIN I  TROPONIN I  CBG MONITORING, ED  CBG MONITORING, ED  ____________________________________________  EKG  Reviewed and interpreted by me at 915 Heart rate 95 QRS 80 QTc 420 Normal sinus rhythm, no evidence of acute ischemia.  Mild T wave abnormalities noted with a slight biphasic appearance in V5 and V6, compared to the patient's previous EKG this appears to be chronic (October 18, 2016).  No notable new changes ____________________________________________  RADIOLOGY  Dg Chest 2 View  Result Date: 09/12/2018 CLINICAL DATA:  Chest pain radiating into the left arm for over 1 week. Shortness of breath. EXAM: CHEST - 2 VIEW COMPARISON:  PA and lateral chest 10/18/2016 and 11/02/2014. FINDINGS: The lungs are clear. Heart size is normal. No pneumothorax or pleural fluid. No acute or focal bony abnormality. IMPRESSION: Normal chest. Electronically Signed   By: Drusilla Kannerhomas  Dalessio M.D.   On: 09/12/2018 10:02    Chest x-ray negative for acute ____________________________________________   PROCEDURES  Procedure(s) performed: None  Procedures  Critical Care performed: No  ____________________________________________   INITIAL IMPRESSION / ASSESSMENT AND PLAN / ED COURSE  Pertinent labs &  imaging results that were available during my care of the patient were reviewed by me and considered in my medical decision making (see chart for details).    Patient presents for evaluation of elevated glucose and intermittent chest discomfort over the last week.  Evaluation in the ER including troponin is reassuring.  Denies any associated pleuritic pain.  Symptoms are not conclusive of f ACS, but does have some components including a pressure-like feeling left chest.  No ripping tearing or moving pain.  No known risk factors for pulmonary embolism.  Discomfort.  Also see abdominal symptoms.  Clinical Course as of Sep 12 1617  Thu Sep 12, 2018  1522 Patient resting comfortably, pain-free.   [MQ]    Clinical Course User Index [MQ] Sharyn CreamerQuale, , MD   HEART score 4, EKG no new changes and trop 2x normal. Painfree at present.  Discussed and offered admission to the patient for further heart testing including to evaluate and rule out for risk of "heart attack".  Patient reports however that his current insurance situation is that he will not have insurance until February 1 of this year.  He strongly elects that due to his concerns around payment and financing that he does not wish to stay unless absolutely necessary.  This point I discussed with the patient, we discussed shared medical decision making in his risk of potentially having a major heart event over the next 30 days, but the present time his troponins and EKG are reassuring.  He is pain-free.  I will discharge the patient and he is in agreement to follow-up closely with cardiology, and I refill his metformin and Actos prescriptions.    Patient will be discharged, following up closely with cardiology with careful return precautions advised.  Return precautions and treatment recommendations and follow-up discussed with the patient who is agreeable with the plan.   ____________________________________________   FINAL CLINICAL IMPRESSION(S)  / ED DIAGNOSES  Final diagnoses:  Chest pain, non-cardiac  Hyperglycemia        Note:  This document was prepared using Dragon voice recognition software and may include unintentional dictation errors       Sharyn CreamerQuale, , MD 09/12/18 2046

## 2018-09-12 NOTE — ED Notes (Signed)
ED Provider at bedside. 

## 2018-09-12 NOTE — ED Notes (Signed)
Pt ambulatory to toilet with steady gait noted. No distress at this time.

## 2018-09-12 NOTE — ED Notes (Signed)
Pt is A/Ox4. VSS. NAD noted at this time. Pt on cardia monitor. CBG 438 has been notified to MD. This RN will continue to monitor.

## 2018-09-12 NOTE — Discharge Instructions (Signed)

## 2018-09-12 NOTE — ED Notes (Signed)
MD at bedside. 

## 2018-10-07 ENCOUNTER — Ambulatory Visit: Payer: Self-pay | Admitting: Cardiology

## 2018-10-29 ENCOUNTER — Ambulatory Visit: Payer: Self-pay | Admitting: Cardiovascular Disease

## 2018-11-26 ENCOUNTER — Other Ambulatory Visit: Payer: Self-pay

## 2018-11-26 ENCOUNTER — Emergency Department (HOSPITAL_COMMUNITY)
Admission: EM | Admit: 2018-11-26 | Discharge: 2018-11-26 | Disposition: A | Discharge status: Home / Self Care | Payer: BLUE CROSS/BLUE SHIELD | Attending: Emergency Medicine | Admitting: Emergency Medicine

## 2018-11-26 DIAGNOSIS — I10 Essential (primary) hypertension: Secondary | ICD-10-CM | POA: Diagnosis not present

## 2018-11-26 DIAGNOSIS — R202 Paresthesia of skin: Secondary | ICD-10-CM | POA: Diagnosis not present

## 2018-11-26 DIAGNOSIS — E119 Type 2 diabetes mellitus without complications: Secondary | ICD-10-CM | POA: Insufficient documentation

## 2018-11-26 DIAGNOSIS — Z79899 Other long term (current) drug therapy: Secondary | ICD-10-CM | POA: Diagnosis not present

## 2018-11-26 NOTE — ED Triage Notes (Signed)
Pt here for pins and needles in right hand for three episodes of twenty minutes today but have all resolved. Pt's PCP suggested he come here.

## 2018-11-26 NOTE — ED Provider Notes (Signed)
Emergency Department Provider Note   I have reviewed the triage vital signs and the nursing notes.   HISTORY  Chief Complaint No chief complaint on file.   HPI Timothy Friedman is a 48 y.o. male with medical problems documented below who presents to the emergency department today with paresthesias in his right hand.  Patient states that he woke up this morning with paresthesias in his right hand that went away approximately 20 minutes.  He had no other associated symptoms.  Around 5:00 it returned after driving and he called his doctor who told to present here if it happened again.  They improved but then shortly after happened again he does remember what he was doing at that time she presented here for further evaluation.  He recently started Actos and thought it might be related to that his blood sugars little bit lower than normal but he has no associated diaphoresis, tremors, paresthesias elsewhere, weakness, numbness, difficulty speaking or other neurologic abnormalities sugars are above 100 at home.  He says the right that is before however after I asked him about the scar in his hands he never early had full return of neurologic exam after a scar on his arm that damage to the arteries, nerves and muscles.   No other associated or modifying symptoms.    Past Medical History:  Diagnosis Date  . Diabetes mellitus   . Hypertension   . Narcolepsy   . Sleep apnea     Patient Active Problem List   Diagnosis Date Noted  . Obesity (BMI 30-39.9) 03/16/2014  . Elevated LFTs 01/29/2013  . Diabetes mellitus type 2, uncontrolled (HCC) 12/11/2012  . Chronic constipation 12/11/2012  . Other and unspecified hyperlipidemia 01/02/2012  . Hypertension 01/02/2012  . Contraception 12/08/2011    Past Surgical History:  Procedure Laterality Date  . LACERATION REPAIR     right forearm    Current Outpatient Rx  . Order #: [id number] Class: Historical Med  . Order #: [id number] Class:  Historical Med  . Order #: [id number] Class: Print  . Order #: [id number] Class: Historical Med  . Order #: [id number] Class: Print  . Order #: [id number] Class: Print  . Order #: [id number] Class: Historical Med  . Order #: [id number] Class: Historical Med  . Order #: [id number] Class: Print  . Order #: [id number] Class: Normal  . Order #: [id number] Class: Normal    Allergies Iodinated diagnostic agents; Iodine; Shellfish allergy; and Linzess [linaclotide]  Family History  Problem Relation Age of Onset  . Diabetes Mother   . Heart disease Father [id number]  . Diabetes Brother     Social History Social History   Tobacco Use  . Smoking status: Never Smoker  . Smokeless tobacco: Never Used  Substance Use Topics  . Alcohol use: Yes    Alcohol/week: 4.0 standard drinks    Types: 4 Glasses of wine per week    Comment: weekends  . Drug use: No    Review of Systems  All other systems negative except as documented in the HPI. All pertinent positives and negatives as reviewed in the HPI. ____________________________________________   PHYSICAL EXAM:  VITAL SIGNS: ED Triage Vitals  Enc Vitals Group     BP 11/26/18 1929 (!) 153/99     Pulse Rate 11/26/18 1929 98     Resp 11/26/18 1929 18     Temp 11/26/18 1929 98.1 F (36.7 C)     Temp Source 11/26/18 1929 Oral     SpO2 11/26/18 1929 98 %  Constitutional: Alert and oriented. Well appearing and in no acute distress. Eyes: Conjunctivae are normal. PERRL. EOMI. Head: Atraumatic. Nose: No congestion/rhinnorhea. Mouth/Throat: Mucous membranes are moist.  Oropharynx non-erythematous. Neck: No stridor.  No meningeal signs.   Cardiovascular: Normal rate, regular rhythm. Good peripheral circulation. Grossly normal heart sounds.   Respiratory: Normal respiratory effort.  No retractions. Lungs CTAB. Gastrointestinal: Soft and nontender. No distention.  Musculoskeletal: No lower extremity tenderness nor edema. No gross deformities of extremities.  Neurologic:  Normal speech and language. No gross focal neurologic deficits are appreciated. Normal sensation, grip strength, sensation in BUE. After holding his wrist in hyperextension for approximately 2 minutes he had return of symptoms especially in pointer and long fingers.  Skin:  Skin is warm, dry. Large crescent shaped scar on radial side of distal forearm. No rash noted.  ____________________________________________   INITIAL IMPRESSION / ASSESSMENT AND PLAN / ED COURSE  Paresthesias that seem peripheral in nature.  Low suspicion for stroke without any other associated symptoms.  Assistant neurologic change.  Able to reproduce symptoms with forced extension of his hand.  Discussed possible use of a splint versus just supportive care at home with hand surgery follow-up.  Patient stable for discharge at this time will follow-up or return here for new/worsening symptoms.  Pertinent labs & imaging results that were available during my care of the patient were reviewed by me and considered in my medical decision making (see chart for details).  A medical screening exam was performed and I feel the patient has had an appropriate workup for their chief complaint at this time and likelihood of emergent condition existing is low. They have been counseled on decision, discharge, follow up and which symptoms necessitate immediate return to the emergency department. They or their family verbally stated understanding and agreement with plan and discharged in stable condition.   ____________________________________________  FINAL CLINICAL IMPRESSION(S) / ED DIAGNOSES  Final diagnoses:  Paresthesias     MEDICATIONS GIVEN DURING THIS VISIT:  Medications - No data to display   NEW OUTPATIENT MEDICATIONS STARTED DURING THIS VISIT:  Discharge Medication List as of 11/26/2018 11:23 PM      Note:  This note was prepared with assistance of Dragon voice recognition software. Occasional wrong-word  or sound-a-like substitutions may have occurred due to the inherent limitations of voice recognition software.   Marily Memos, MD 11/26/18 [phone]

## 2019-08-19 ENCOUNTER — Emergency Department (HOSPITAL_COMMUNITY): Payer: BC Managed Care – PPO

## 2019-08-19 ENCOUNTER — Encounter (HOSPITAL_COMMUNITY): Payer: Self-pay

## 2019-08-19 ENCOUNTER — Emergency Department (HOSPITAL_COMMUNITY)
Admission: EM | Admit: 2019-08-19 | Discharge: 2019-08-19 | Disposition: A | Discharge status: Home / Self Care | Payer: BC Managed Care – PPO | Attending: Emergency Medicine | Admitting: Emergency Medicine

## 2019-08-19 DIAGNOSIS — M79641 Pain in right hand: Secondary | ICD-10-CM | POA: Insufficient documentation

## 2019-08-19 DIAGNOSIS — I10 Essential (primary) hypertension: Secondary | ICD-10-CM | POA: Diagnosis not present

## 2019-08-19 DIAGNOSIS — Z7984 Long term (current) use of oral hypoglycemic drugs: Secondary | ICD-10-CM | POA: Diagnosis not present

## 2019-08-19 DIAGNOSIS — E119 Type 2 diabetes mellitus without complications: Secondary | ICD-10-CM | POA: Insufficient documentation

## 2019-08-19 DIAGNOSIS — Z79899 Other long term (current) drug therapy: Secondary | ICD-10-CM | POA: Insufficient documentation

## 2019-08-19 MED ORDER — NAPROXEN 500 MG PO TABS: 500.0000 mg | ORAL_TABLET | Freq: Two times a day (BID) | ORAL | 0 refills | Status: AC

## 2019-08-19 MED ORDER — NAPROXEN 250 MG PO TABS
500.0000 mg | ORAL_TABLET | Freq: Once | ORAL | Status: AC
  Administered 2019-08-19: 23:00:00 500 mg via ORAL
  Filled 2019-08-19: qty 2

## 2019-08-19 NOTE — ED Triage Notes (Signed)
Pt states that for the past week he has been having R hand pain, denies injury

## 2019-08-19 NOTE — ED Provider Notes (Signed)
[facility] EMERGENCY DEPARTMENT Provider Note   CSN: [id number] Arrival date & time: 08/19/19  2133     History Chief Complaint  Patient presents with  . Hand Pain    Timothy Friedman is a 49 y.o. right-hand-dominant male with past medical history significant for diabetes, hypertension, sleep apnea presents to emergency department today with right hand pain x 2 weeks. He denies any injury or trauma. He states he has pain when attempting to lift his right hand as if trying to "make a stop sign." He describes the pain as aching and sharp rating it 10/10. Pain does not radiate.  He has no pain at rest.  He has not taken any medications for pain prior to arrival.  He denies any history of similar pain. He admits to being told he has carpal tunnel in the past, but this pain is different. He denies any fever, chills, wound, laceration, swelling, erythema, numbness, tingling, decreased sensation. History provided by patient with additional history obtained from chart review.     Past Medical History:  Diagnosis Date  . Diabetes mellitus   . Hypertension   . Narcolepsy   . Sleep apnea     Patient Active Problem List   Diagnosis Date Noted  . Obesity (BMI 30-39.9) 03/16/2014  . Elevated LFTs 01/29/2013  . Diabetes mellitus type 2, uncontrolled (HCC) 12/11/2012  . Chronic constipation 12/11/2012  . Other and unspecified hyperlipidemia 01/02/2012  . Hypertension 01/02/2012  . Contraception 12/08/2011    Past Surgical History:  Procedure Laterality Date  . LACERATION REPAIR     right forearm       Family History  Problem Relation Age of Onset  . Diabetes Mother   . Heart disease Father 67  . Diabetes Brother     Social History   Tobacco Use  . Smoking status: Never Smoker  . Smokeless tobacco: Never Used  Substance Use Topics  . Alcohol use: Yes    Alcohol/week: 4.0 standard drinks    Types: 4 Glasses of wine per week    Comment: weekends  . Drug use:  No    Home Medications Prior to Admission medications   Medication Sig Start Date End Date Taking? Authorizing Provider  acetaminophen (TYLENOL) 500 MG tablet Take 500-1,000 mg by mouth every 6 (six) hours as needed (for headaches or pain).     [provider]  FLUoxetine (PROZAC) 20 MG capsule Take 20 mg by mouth daily.    [provider]  glipiZIDE (GLUCOTROL XL) 10 MG 24 hr tablet Take 2 tablets (20 mg total) by mouth daily before breakfast. 01/09/18   Antony Madura, PA-C  hydrochlorothiazide (HYDRODIURIL) 25 MG tablet Take 25 mg by mouth daily. 12/24/17   [provider]  HYDROcodone-acetaminophen (NORCO/VICODIN) 5-325 MG per tablet Take 1-2 tablets by mouth every 6 (six) hours as needed. Patient not taking: Reported on 11/26/2018 11/17/14   Roxy Horseman, PA-C  Linaclotide [facility]) 145 MCG CAPS Take 1 capsule (145 mcg total) by mouth daily. Patient not taking: Reported on 11/26/2018 12/11/12   Shelia Media, MD  lisinopril (PRINIVIL,ZESTRIL) 5 MG tablet Take 1 tablet (5 mg total) by mouth daily. Patient not taking: Reported on 11/26/2018 03/16/14 11/26/18  Shelia Media, MD  metFORMIN (GLUCOPHAGE-XR) 500 MG 24 hr tablet Take 1 tablet (500 mg total) by mouth 2 (two) times daily with a meal. Patient taking differently: Take 2,000 mg by mouth daily with breakfast.  09/12/18   Quale,  Elta Guadeloupe, MD  naproxen (NAPROSYN) 500 MG tablet Take 1 tablet (500 mg total) by mouth 2 (two) times daily for 14 days. 08/19/19 09/02/19  Shaquel Chavous E, PA-C  pioglitazone (ACTOS) 15 MG tablet Take 1 tablet (15 mg total) by mouth daily. 09/12/18 09/12/19  Delman Kitten, MD  simvastatin (ZOCOR) 20 MG tablet Take 20 mg by mouth at bedtime. 01/18/18 01/18/19  [provider]  valsartan (DIOVAN) 80 MG tablet Take 80 mg by mouth daily.    [provider]    Allergies    Iodinated diagnostic agents, Iodine, Shellfish allergy, and Linzess [linaclotide]  Review of Systems     Review of Systems  All other systems are reviewed and are negative for acute change except as noted in the HPI.   Physical Exam Updated Vital Signs BP (!) 144/90 (BP Location: Right Arm)   Pulse 88   Temp 98.5 F (36.9 C) (Oral)   Resp 16   SpO2 96%   Physical Exam Vitals and nursing note reviewed.  Constitutional:      Appearance: He is well-developed. He is not ill-appearing or toxic-appearing.  HENT:     Head: Normocephalic and atraumatic.     Nose: Nose normal.  Eyes:     General: No scleral icterus.       Right eye: No discharge.        Left eye: No discharge.     Conjunctiva/sclera: Conjunctivae normal.  Neck:     Vascular: No JVD.  Cardiovascular:     Rate and Rhythm: Normal rate and regular rhythm.     Pulses: Normal pulses.     Heart sounds: Normal heart sounds.  Pulmonary:     Effort: Pulmonary effort is normal.     Breath sounds: Normal breath sounds.  Abdominal:     General: There is no distension.  Musculoskeletal:        General: Normal range of motion.       Arms:     Cervical back: Normal range of motion.     Comments: Right hand with tenderness to palpation as documented in image above.  No swelling.There is no joint effusion noted. Full passive ROM. Patient is unable to make stop sign with right hand and cannot lift wrist more than 30 degrees in extension. He has normal flexion. No erythema or warmth overlaying the joint. There is no anatomic snuff box tenderness. Normal sensation and motor function in the median and radial nerve distributions. 2+ radial pulse. Brisk cap refill.  Grip strength 5/5 in bilateral upper extremities.   Skin:    General: Skin is warm and dry.  Neurological:     Mental Status: He is oriented to person, place, and time.     GCS: GCS eye subscore is 4. GCS verbal subscore is 5. GCS motor subscore is 6.     Comments: Fluent speech, no facial droop.  Psychiatric:        Behavior: Behavior normal.       ED Results /  Procedures / Treatments   Labs (all labs ordered are listed, but only abnormal results are displayed) Labs Reviewed - No data to display  EKG None  Radiology DG Hand Complete Right  Result Date: 08/19/2019 CLINICAL DATA:  Posterior right hand pain and swelling x2 weeks. EXAM: RIGHT HAND - COMPLETE 3+ VIEW COMPARISON:  None. FINDINGS: There is no evidence of fracture or dislocation. A 5.4 mm area of cortical exostosis is seen along the distal aspect  of the proximal phalanx of the fifth right finger. There is no evidence of arthropathy or other focal bone abnormality. Soft tissues are unremarkable. IMPRESSION: 1. No acute osseous abnormality. 2. 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 Electronically Signed   By: Aram Candela M.D.   On: 08/19/2019 22:02    Procedures Procedures (including critical care time)  Medications Ordered in ED Medications  naproxen (NAPROSYN) tablet 500 mg (has no administration in time range)    ED Course  I have reviewed the triage vital signs and the nursing notes.  Pertinent labs & imaging results that were available during my care of the patient were reviewed by me and considered in my medical decision making (see chart for details).    MDM Rules/Calculators/A&P                      Patient seen and examined. Patient nontoxic appearing, in no apparent distress, vitals WNL. Xray of right hand ordered in triage. I viewed it and do not see evidence of acute fracture or dislocation. His exam is suggestive of flexor tenosynovitis. He is unable to make a stop sign with right hand but exam is otherwise normal.  No numbness, weakness or tingling.  He has strength against resistance.  He is able to touch thumb to each finger.  He is neurovascularly intact.  Brisk cap refill.  Will give cock-up wrist splint and treat with NSAIDs.  Recommend he follow-up with on-call hand Dr. Amanda Pea. There are no signs of infection, but did  discuss with patient signs and symptoms to look out for including fever, chills, swelling, redness, streaking prompt immediate return return to the emergency department  The patient appears reasonably screened and/or stabilized for discharge and I doubt any other medical condition or other [facility] requiring further screening, evaluation, or treatment in the ED at this time prior to discharge. The patient is safe for discharge with strict return precautions discussed.   Portions of this note were generated with Scientist, clinical (histocompatibility and immunogenetics). Dictation errors may occur despite best attempts at proofreading.   Final Clinical Impression(s) / ED Diagnoses Final diagnoses:  Pain of right hand    Rx / DC Orders ED Discharge Orders         Ordered    naproxen (NAPROSYN) 500 MG tablet  2 times daily     08/19/19 2252           Kathyrn Lass 08/19/19 2317    Linwood Dibbles, MD 08/20/19 2312

## 2019-08-19 NOTE — Discharge Instructions (Addendum)
You have been seen today for hand pain. Please read and follow all provided instructions. Return to the emergency room for worsening condition or new concerning symptoms.    -We suspect the pain in your hand is related to swelling of your tendons in your hand  1. Medications:  Prescription sent to your pharmacy for naproxen.  Please take this as prescribed.  You should take this medicine with food so it does not upset your stomach.  This is an anti-inflammatory medicine so you should not take any additional ibuprofen, Aleve, Motrin, Goody powders as they are all similar.  Continue usual home medications  Take medications as prescribed. Please review all of the medicines and only take them if you do not have an allergy to them.   2. Treatment: rest, drink plenty of fluids.  Wear splint for comfort.  3. Follow Up:  Please follow up the hand doctor listed in your discharge paperwork Dr. Amanda Pea.  You can call his office tomorrow to schedule the next available follow-up appointment.  When you call please say "this is an emergency department follow-up visit." -If you difficulty getting an appointment with the hand doctor, you can also follow-up with your primary care provider.  It is also a possibility that you have an allergic reaction to any of the medicines that you have been prescribed - Everybody reacts differently to medications and while MOST people have no trouble with most medicines, you may have a reaction such as nausea, vomiting, rash, swelling, shortness of breath. If this is the case, please stop taking the medicine immediately and contact your physician.  ?

## 2019-11-06 IMAGING — CR DG CHEST 2V
1 series · 2 of 2 positions shown · non-contrast
Comparison: PA and lateral chest 10/18/2016 and 11/02/2014.

CLINICAL DATA: Chest pain radiating into the left arm for over 1
week. Shortness of breath.

EXAM:
CHEST - 2 VIEW

[Series 1: dg chest 2 view · 0.14mm/px · 2 of 2 slices shown]
[im 1/2]
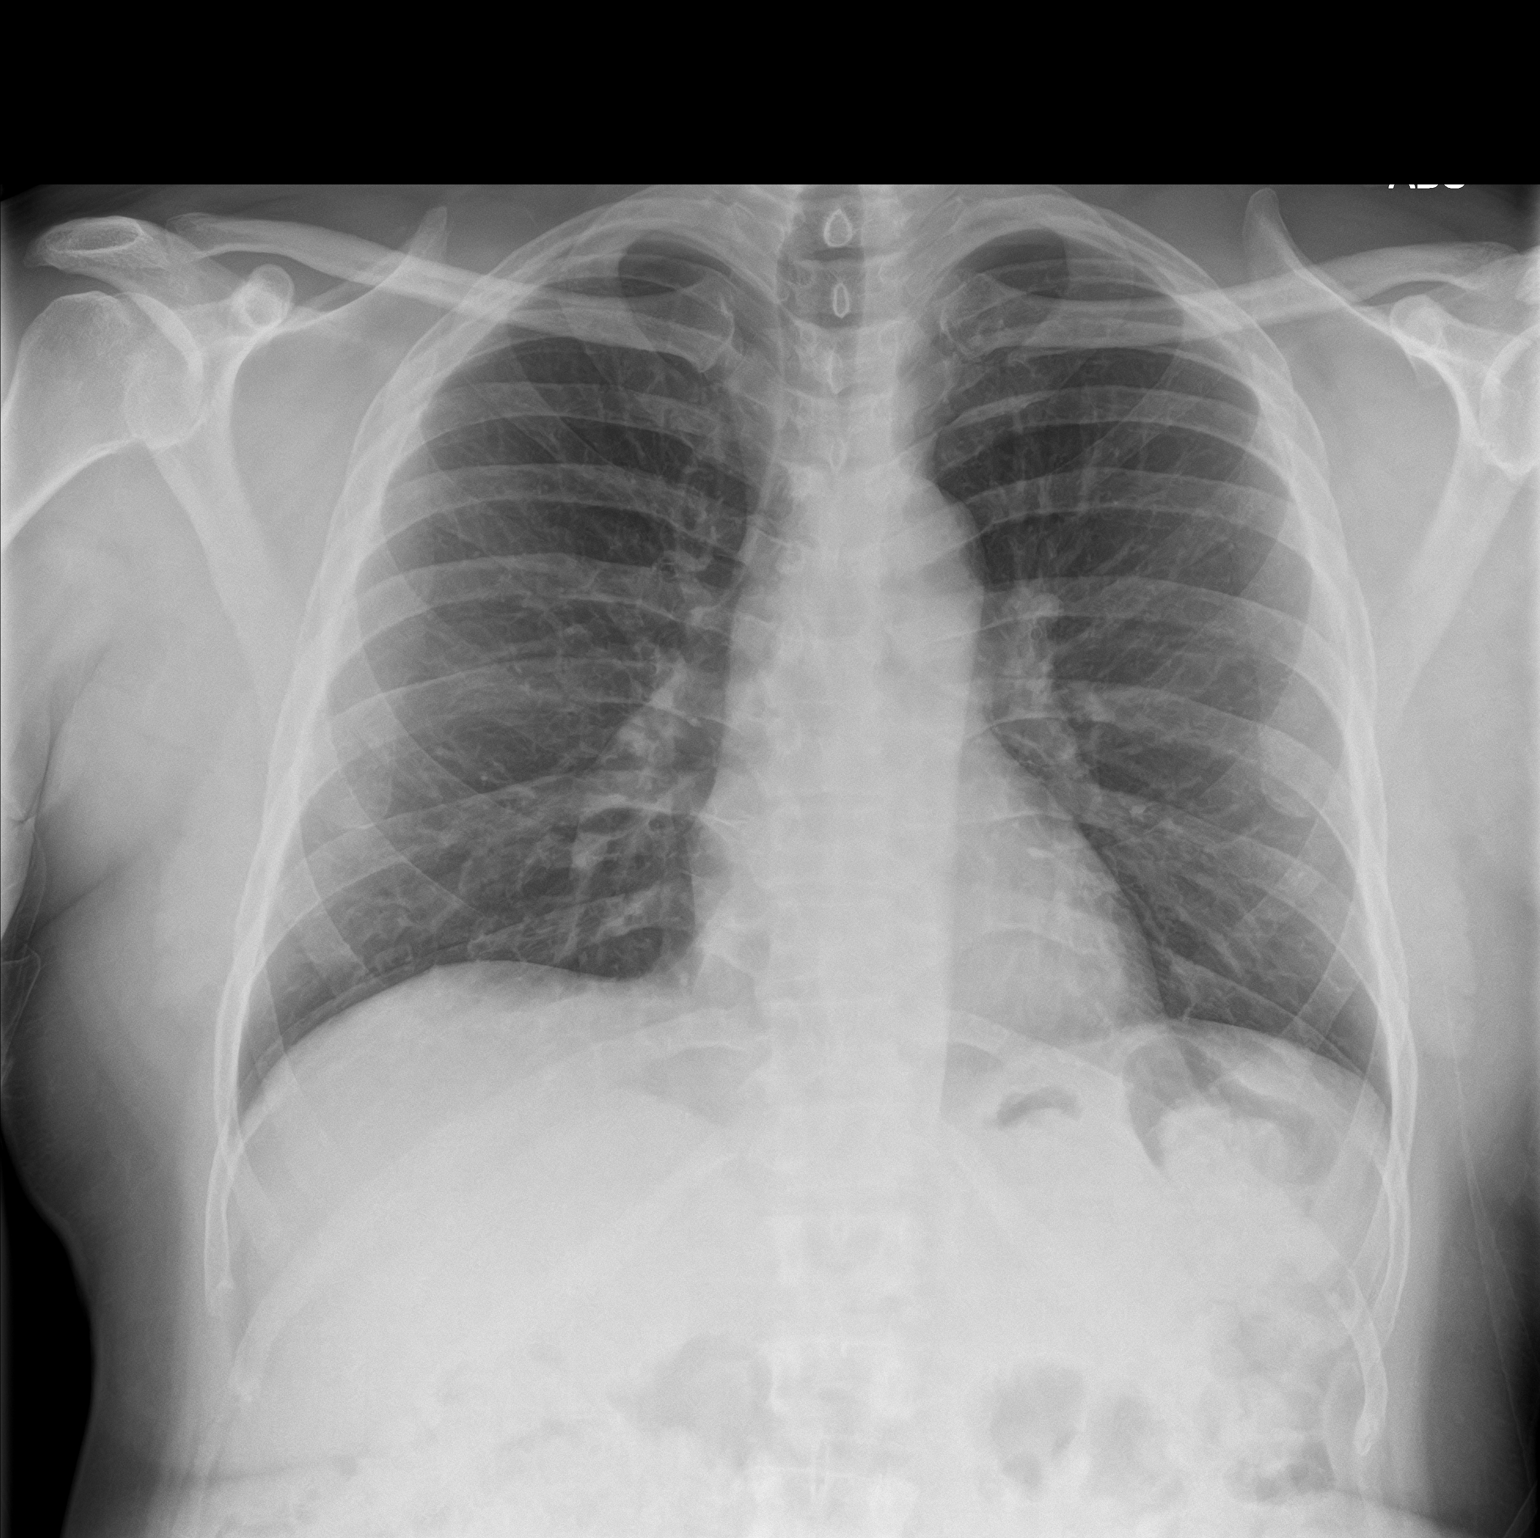
[im 2/2]
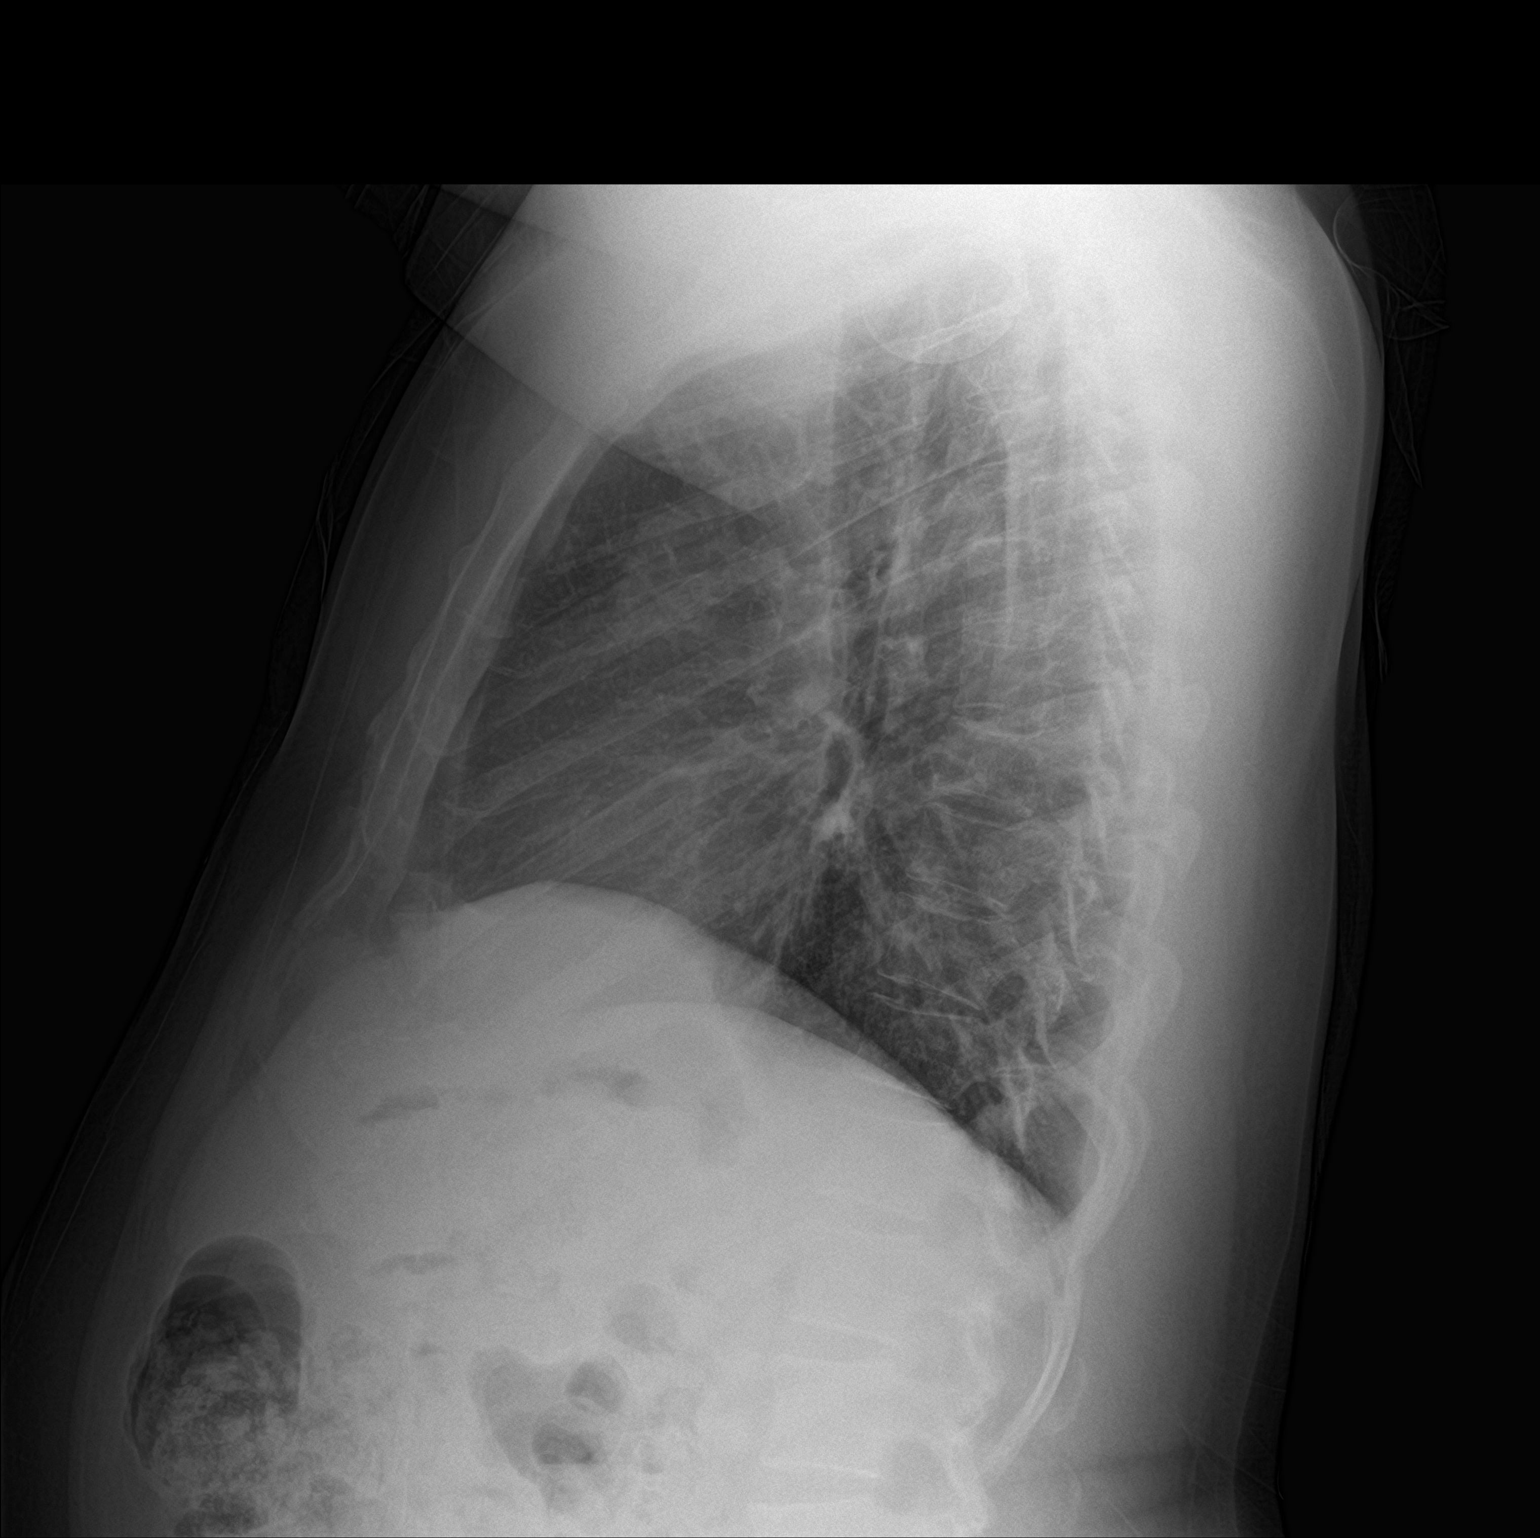

[2 of 2 positions shown; findings below may reference images not displayed]

FINDINGS: The lungs are clear. Heart size is normal. No pneumothorax or
pleural fluid. No acute or focal bony abnormality.
IMPRESSION: Normal chest.

## 2020-10-12 IMAGING — DX DG HAND COMPLETE 3+V*R*
3 series · 3 of 3 positions shown · non-contrast
Comparison: None.

CLINICAL DATA: Posterior right hand pain and swelling x2 weeks.

EXAM:
RIGHT HAND - COMPLETE 3+ VIEW

[x hand pa right]
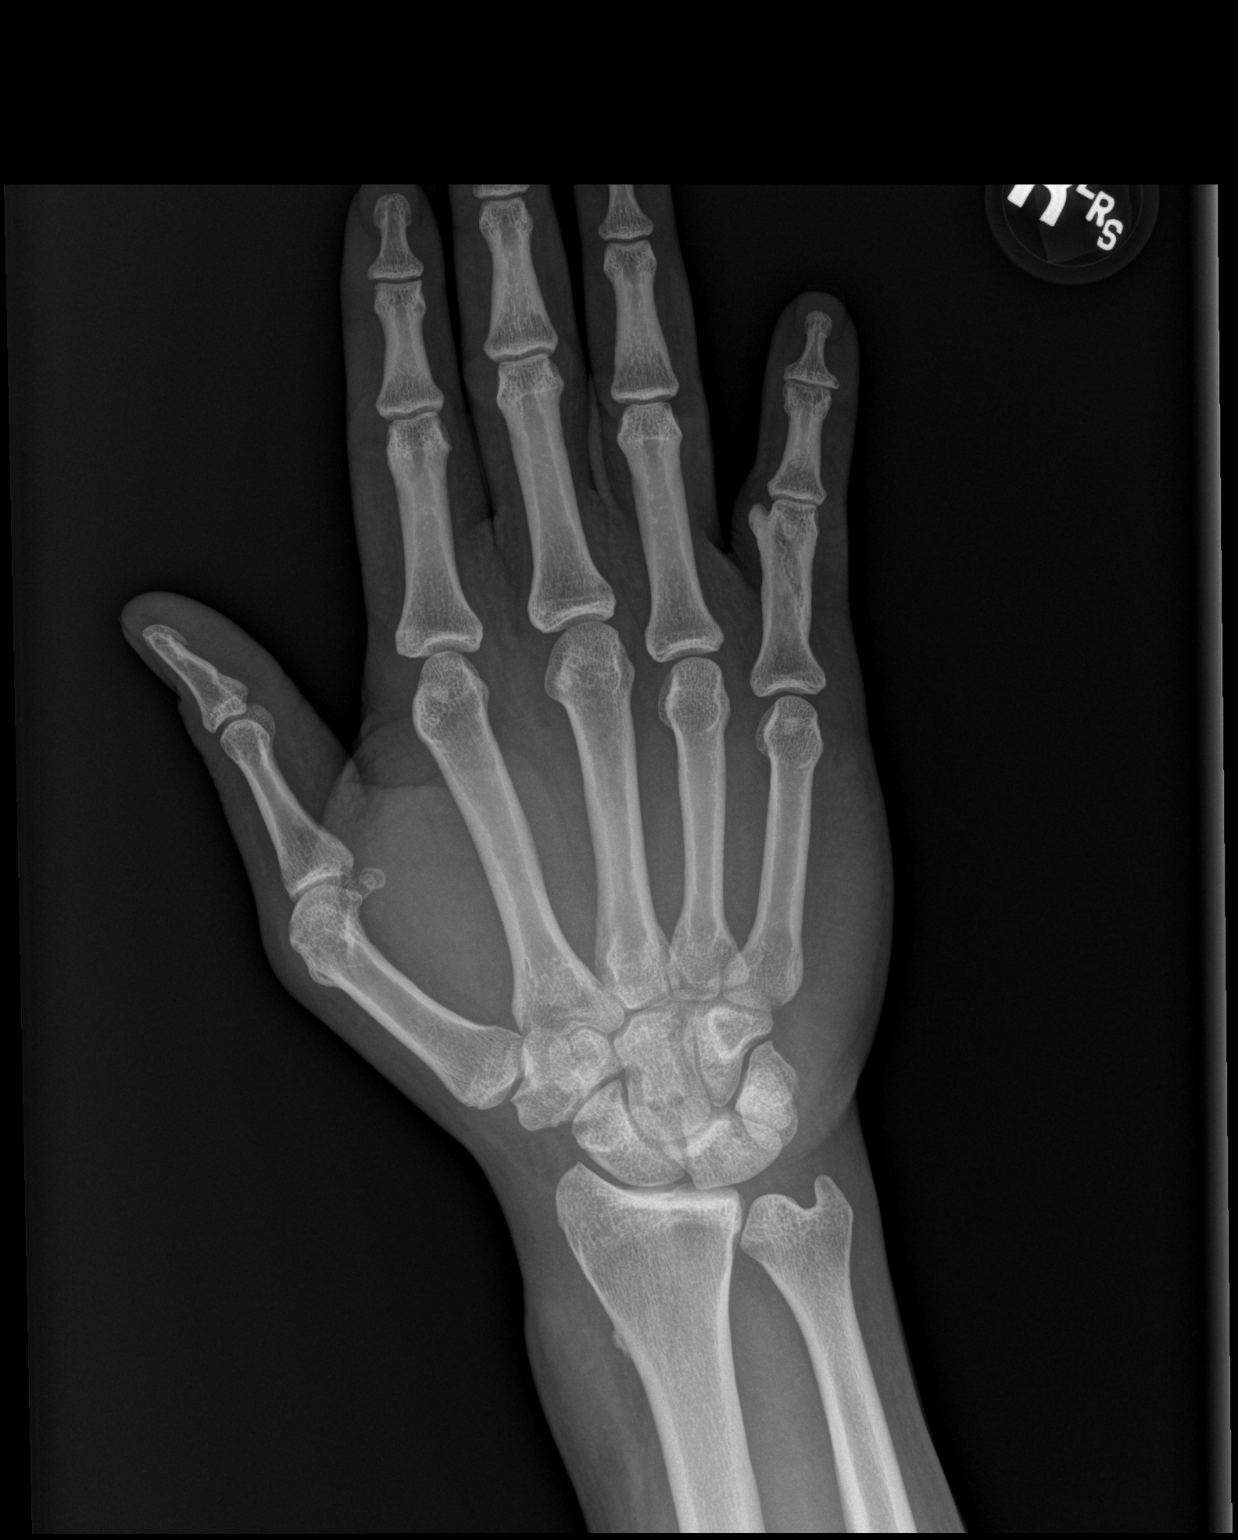

[x hand obl right]
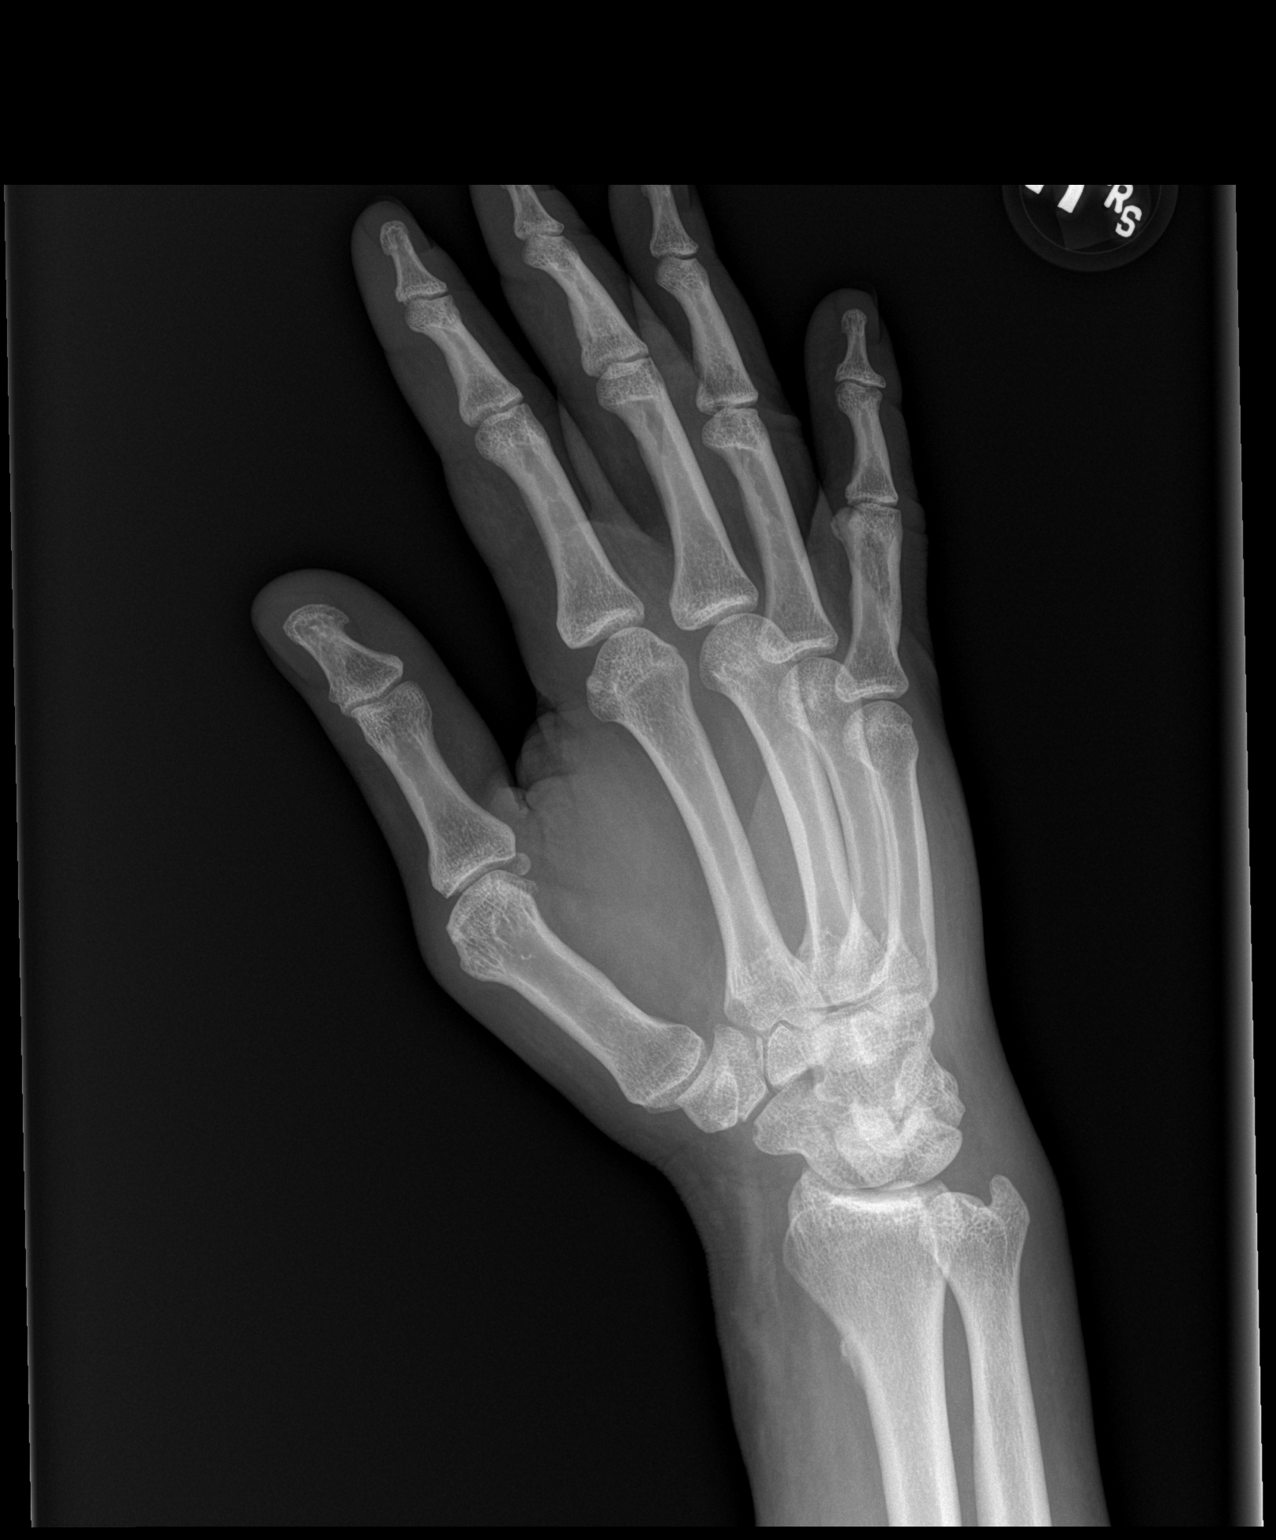

[x hand lat right]
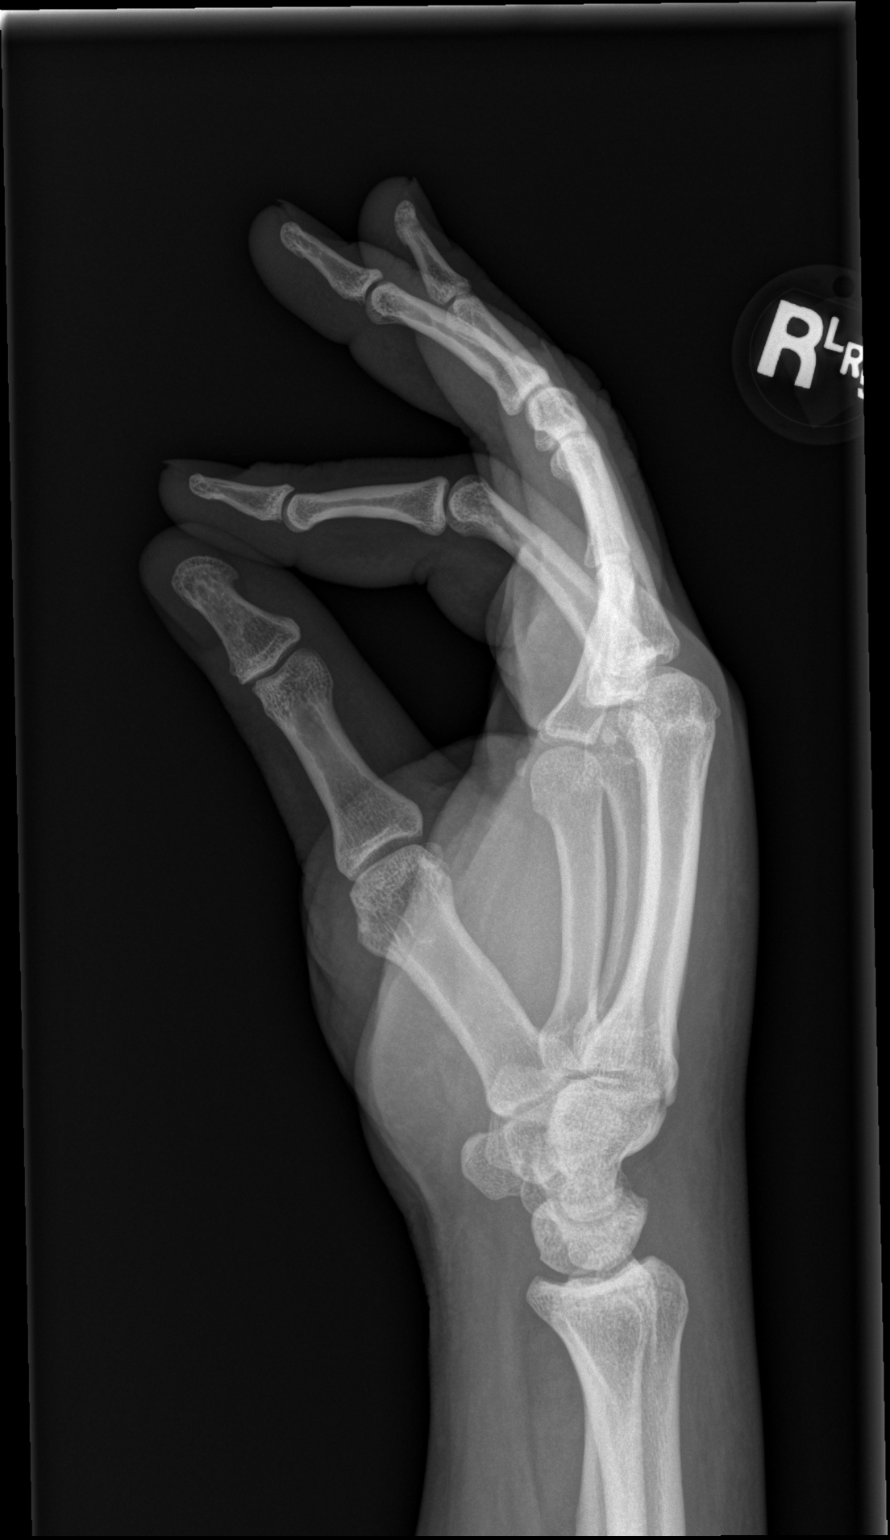

[3 of 3 positions shown; findings below may reference images not displayed]

FINDINGS: There is no evidence of fracture or dislocation. A 5.4 mm area of
cortical exostosis is seen along the distal aspect of the proximal
phalanx of the fifth right finger. There is no evidence of
arthropathy or other focal bone abnormality. Soft tissues are
unremarkable.
IMPRESSION: 1. No acute osseous abnormality.
2. Chronic deformity involving the proximal phalanx of the fifth
right finger.

## 2020-11-19 ENCOUNTER — Other Ambulatory Visit: Payer: Self-pay

## 2020-11-19 ENCOUNTER — Other Ambulatory Visit
Admission: RE | Admit: 2020-11-19 | Discharge: 2020-11-19 | Disposition: A | Discharge status: Home / Self Care | Payer: BC Managed Care – PPO | Source: Ambulatory Visit | Attending: Gastroenterology | Admitting: Gastroenterology

## 2020-11-19 DIAGNOSIS — Z20822 Contact with and (suspected) exposure to covid-19: Secondary | ICD-10-CM | POA: Insufficient documentation

## 2020-11-19 DIAGNOSIS — Z01812 Encounter for preprocedural laboratory examination: Secondary | ICD-10-CM | POA: Diagnosis present

## 2020-11-19 LAB — SARS CORONAVIRUS 2 (TAT 6-24 HRS): SARS Coronavirus 2: NEGATIVE

## 2020-11-22 ENCOUNTER — Encounter: Payer: Self-pay | Admitting: *Deleted

## 2020-11-23 ENCOUNTER — Ambulatory Visit: Admission: RE | Admit: 2020-11-23 | Payer: BC Managed Care – PPO | Source: Home / Self Care

## 2020-11-23 ENCOUNTER — Encounter: Admission: RE | Payer: Self-pay | Source: Home / Self Care

## 2020-11-23 HISTORY — DX: Hyperlipidemia, unspecified: E78.5

## 2020-11-23 HISTORY — DX: Constipation, unspecified: K59.00

## 2020-11-23 HISTORY — DX: Premature ejaculation: F52.4

## 2020-11-23 SURGERY — COLONOSCOPY WITH PROPOFOL
Anesthesia: General

## 2021-02-01 ENCOUNTER — Encounter: Admission: RE | Payer: Self-pay | Source: Home / Self Care

## 2021-02-01 ENCOUNTER — Ambulatory Visit: Admission: RE | Admit: 2021-02-01 | Payer: BC Managed Care – PPO | Source: Home / Self Care

## 2021-02-01 SURGERY — COLONOSCOPY WITH PROPOFOL
Anesthesia: General

## 2021-07-25 ENCOUNTER — Ambulatory Visit: Admission: RE | Admit: 2021-07-25 | Payer: BC Managed Care – PPO | Source: Home / Self Care

## 2021-07-25 ENCOUNTER — Encounter: Admission: RE | Payer: Self-pay | Source: Home / Self Care

## 2021-07-25 SURGERY — COLONOSCOPY
Anesthesia: General

## 2021-11-10 ENCOUNTER — Ambulatory Visit: Admit: 2021-11-10 | Payer: BC Managed Care – PPO | Admitting: Gastroenterology

## 2021-11-10 SURGERY — COLONOSCOPY WITH PROPOFOL
Anesthesia: General

## 2022-08-15 ENCOUNTER — Other Ambulatory Visit: Payer: Self-pay | Admitting: Gastroenterology

## 2022-08-15 DIAGNOSIS — R112 Nausea with vomiting, unspecified: Secondary | ICD-10-CM

## 2022-08-15 DIAGNOSIS — R140 Abdominal distension (gaseous): Secondary | ICD-10-CM

## 2022-08-15 DIAGNOSIS — R1013 Epigastric pain: Secondary | ICD-10-CM

## 2022-09-01 ENCOUNTER — Encounter
Admission: RE | Admit: 2022-09-01 | Discharge: 2022-09-01 | Disposition: A | Discharge status: Home / Self Care | Payer: 59 | Source: Ambulatory Visit | Attending: Gastroenterology | Admitting: Gastroenterology

## 2022-09-01 DIAGNOSIS — R140 Abdominal distension (gaseous): Secondary | ICD-10-CM | POA: Diagnosis present

## 2022-09-01 DIAGNOSIS — R1013 Epigastric pain: Secondary | ICD-10-CM | POA: Insufficient documentation

## 2022-09-01 DIAGNOSIS — R112 Nausea with vomiting, unspecified: Secondary | ICD-10-CM | POA: Diagnosis present

## 2022-09-01 MED ORDER — TECHNETIUM TC 99M SULFUR COLLOID
2.0000 | Freq: Once | INTRAVENOUS | Status: AC | PRN
  Administered 2022-09-01: 2.26 via ORAL

## 2023-01-12 ENCOUNTER — Encounter: Payer: Self-pay | Admitting: Internal Medicine

## 2023-01-16 ENCOUNTER — Emergency Department (HOSPITAL_COMMUNITY)
Admission: EM | Admit: 2023-01-16 | Discharge: 2023-01-16 | Disposition: A | Discharge status: Home / Self Care | Payer: Commercial Managed Care - PPO | Attending: Emergency Medicine | Admitting: Emergency Medicine

## 2023-01-16 ENCOUNTER — Ambulatory Visit
Admission: EM | Admit: 2023-01-16 | Discharge: 2023-01-16 | Disposition: A | Discharge status: Home / Self Care | Payer: 59

## 2023-01-16 ENCOUNTER — Encounter (HOSPITAL_COMMUNITY): Payer: Self-pay

## 2023-01-16 ENCOUNTER — Other Ambulatory Visit: Payer: Self-pay

## 2023-01-16 DIAGNOSIS — K6289 Other specified diseases of anus and rectum: Secondary | ICD-10-CM | POA: Diagnosis not present

## 2023-01-16 DIAGNOSIS — K921 Melena: Secondary | ICD-10-CM | POA: Diagnosis present

## 2023-01-16 DIAGNOSIS — R195 Other fecal abnormalities: Secondary | ICD-10-CM | POA: Diagnosis not present

## 2023-01-16 DIAGNOSIS — Z79899 Other long term (current) drug therapy: Secondary | ICD-10-CM | POA: Diagnosis not present

## 2023-01-16 DIAGNOSIS — K5900 Constipation, unspecified: Secondary | ICD-10-CM | POA: Insufficient documentation

## 2023-01-16 DIAGNOSIS — E119 Type 2 diabetes mellitus without complications: Secondary | ICD-10-CM | POA: Diagnosis not present

## 2023-01-16 DIAGNOSIS — I10 Essential (primary) hypertension: Secondary | ICD-10-CM | POA: Diagnosis not present

## 2023-01-16 DIAGNOSIS — Z7984 Long term (current) use of oral hypoglycemic drugs: Secondary | ICD-10-CM | POA: Diagnosis not present

## 2023-01-16 HISTORY — DX: Small intestinal bacterial overgrowth, unspecified: K63.8219

## 2023-01-16 LAB — TYPE AND SCREEN
ABO/RH(D): B POS
Antibody Screen: NEGATIVE

## 2023-01-16 LAB — CBC
HCT: 38.5 % — ABNORMAL LOW (ref 39.0–52.0)
Hemoglobin: 12.3 g/dL — ABNORMAL LOW (ref 13.0–17.0)
MCH: 29.9 pg (ref 26.0–34.0)
MCHC: 31.9 g/dL (ref 30.0–36.0)
MCV: 93.7 fL (ref 80.0–100.0)
Platelets: 263 10*3/uL (ref 150–400)
RBC: 4.11 MIL/uL — ABNORMAL LOW (ref 4.22–5.81)
RDW: 13.9 % (ref 11.5–15.5)
WBC: 4.3 10*3/uL (ref 4.0–10.5)
nRBC: 0 % (ref 0.0–0.2)

## 2023-01-16 LAB — COMPREHENSIVE METABOLIC PANEL
ALT: 20 U/L (ref 0–44)
AST: 19 U/L (ref 15–41)
Albumin: 3.8 g/dL (ref 3.5–5.0)
Alkaline Phosphatase: 44 U/L (ref 38–126)
Anion gap: 10 (ref 5–15)
BUN: 18 mg/dL (ref 6–20)
CO2: 22 mmol/L (ref 22–32)
Calcium: 9.1 mg/dL (ref 8.9–10.3)
Chloride: 106 mmol/L (ref 98–111)
Creatinine, Ser: 1.3 mg/dL — ABNORMAL HIGH (ref 0.61–1.24)
GFR, Estimated: >60 mL/min (ref >60)
Glucose, Bld: 131 mg/dL — ABNORMAL HIGH (ref 70–99)
Potassium: 4 mmol/L (ref 3.5–5.1)
Sodium: 138 mmol/L (ref 135–145)
Total Bilirubin: 0.5 mg/dL (ref 0.3–1.2)
Total Protein: 6.2 g/dL — ABNORMAL LOW (ref 6.5–8.1)

## 2023-01-16 LAB — DIFFERENTIAL

## 2023-01-16 LAB — POC OCCULT BLOOD, ED: Fecal Occult Bld: NEGATIVE

## 2023-01-16 NOTE — ED Provider Triage Note (Signed)
Emergency Medicine Provider Triage Evaluation Note  Timothy Friedman , a 52 y.o. male  was evaluated in triage.  Pt complains of black stools.  Has had 3 episodes.  Some abdominal discomfort.  No epigastric abdominal pain.  Denies NSAID or heavy alcohol use.  Denies lightheadedness, chest pain, or shortness of breath.  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  Is due to undergo colonoscopy on 6/7.Marland Kitchen  Review of Systems  Positive: As above Negative: As above  Physical Exam  BP (!) 139/91   Pulse 73   Temp 98.9 F (37.2 C) (Oral)   Resp 16   Ht 5\' 6"  (1.676 m)   Wt 95.3 kg   SpO2 100%   BMI 33.91 kg/m  Gen:   Awake, no distress   Resp:  Normal effort  MSK:   Moves extremities without difficulty  Other:    Medical Decision Making  Medically screening exam initiated at 4:44 PM.  Appropriate orders placed.  Timothy Friedman was informed that the remainder of the evaluation will be completed by another provider, this initial triage assessment does not replace that evaluation, and the importance of remaining in the ED until their evaluation is complete.     Marita Kansas, PA-C 01/16/23 1645

## 2023-01-16 NOTE — ED Triage Notes (Addendum)
Pt states urine was brown a few days ago. Pt c/o painful BM and black stool two days ago. Pt stated called GI dr and they told him to come to ED. PT c/o "grainy, dark stool." Pt denies blood thinners

## 2023-01-16 NOTE — ED Triage Notes (Addendum)
Patient to Urgent Care with complaints of dark/ brown urination. Reports "grainy" stools, yesterday had pain when having a bowel movement and noticed it was dark in color. Reports chronic constipation but had three bowl movements yesterday and all were painful. Reports stools today were almost black.   Diagnosed with SIBO by his GI. Has been taking flagyl since 5/23. Scheduled for a colonscopy on Friday. Advised to be seen at Urgent Care or ER by GI doctor due to new concerning symptoms.

## 2023-01-16 NOTE — ED Provider Notes (Signed)
[facility] AT [facility] Provider Note   CSN: [id number] Arrival date & time: 01/16/23  1628     History  Chief Complaint  Patient presents with   GI Bleeding    Timothy Friedman is a 52 y.o. male.  HPI Patient with reported black stool.  Has had for the last couple days.  Reportedly has had some constipation.  Due for colonoscopy in 3 days.  Has been on Flagyl.  Not feeling lightheadedness or dizzy.  Has had some rectal pain however.  States pain with having a bowel movement.  Slight pain after but really cleared up.   Past Medical History:  Diagnosis Date   Allergic rhinitis    Anxiety    Constipation    Depression    Diabetes mellitus    Dyspnea    GERD (gastroesophageal reflux disease)    Hyperlipidemia    Hypertension    Narcolepsy    Obesity    Premature ejaculation    Sleep apnea    Small intestinal bacterial overgrowth (SIBO)     Home Medications Prior to Admission medications   Medication Sig Start Date End Date Taking? Authorizing Provider  acetaminophen (TYLENOL) 500 MG tablet Take 500-1,000 mg by mouth every 6 (six) hours as needed (for headaches or pain).     [provider]  blood glucose meter kit and supplies KIT by Does not apply route daily as needed. Dispense based on patient and insurance preference. Use up to four times daily as directed.    [provider]  ezetimibe (ZETIA) 10 MG tablet Take 10 mg by mouth daily.    [provider]  famotidine (PEPCID) 20 MG tablet Take 20 mg by mouth 2 (two) times daily.    [provider]  FLUoxetine (PROZAC) 20 MG capsule Take 20 mg by mouth daily.    [provider]  glipiZIDE (GLUCOTROL XL) 10 MG 24 hr tablet Take 2 tablets (20 mg total) by mouth daily before breakfast. 01/09/18   Antony Madura, PA-C  hydrochlorothiazide (HYDRODIURIL) 25 MG tablet Take 25 mg by mouth daily. 12/24/17   [provider]  HYDROcodone-acetaminophen  (NORCO/VICODIN) 5-325 MG per tablet Take 1-2 tablets by mouth every 6 (six) hours as needed. Patient not taking: Reported on 01/16/2023 11/17/14   Roxy Horseman, PA-C  Linaclotide [facility]) 145 MCG CAPS Take 1 capsule (145 mcg total) by mouth daily. Patient not taking: Reported on 01/16/2023 12/11/12   Shelia Media, MD  lisinopril (PRINIVIL,ZESTRIL) 5 MG tablet Take 1 tablet (5 mg total) by mouth daily. Patient not taking: Reported on 11/26/2018 03/16/14 11/26/18  Shelia Media, MD  lubiprostone (AMITIZA) 8 MCG capsule Take 8 mcg by mouth 2 (two) times daily with a meal.    [provider]  metFORMIN (GLUCOPHAGE-XR) 500 MG 24 hr tablet Take 1 tablet (500 mg total) by mouth 2 (two) times daily with a meal. Patient taking differently: Take 2,000 mg by mouth daily with breakfast.  09/12/18   Sharyn Creamer, MD  metroNIDAZOLE (FLAGYL) 250 MG tablet Take by mouth. 01/04/23 01/18/23  [provider]  pioglitazone (ACTOS) 15 MG tablet Take 1 tablet (15 mg total) by mouth daily. 09/12/18 09/12/19  Sharyn Creamer, MD  rifaximin (XIFAXAN) 550 MG TABS tablet Take 550 mg by mouth. Patient not taking: Reported on 01/16/2023    [provider]  simvastatin (ZOCOR) 20 MG tablet Take 20 mg by mouth at bedtime. 01/18/18 01/18/19  [provider]  sitaGLIPtin (JANUVIA) 100 MG tablet Take 100 mg by mouth daily. Patient not taking: Reported on 01/16/2023    [provider]  valsartan (DIOVAN) 80 MG tablet Take 80 mg by mouth daily.    [provider]      Allergies    Iodinated contrast media, Iodine, Shellfish allergy, and Linzess [linaclotide]    Review of Systems   Review of Systems  Physical Exam Updated Vital Signs BP 130/82 (BP Location: Right Arm)   Pulse 82   Temp 98.3 F (36.8 C)   Resp 20   Ht 5\' 6"  (1.676 m)   Wt 95.3 kg   SpO2 100%   BMI 33.91 kg/m  Physical Exam Vitals and nursing note reviewed.  Eyes:     Pupils: Pupils are equal, round, and  reactive to light.  Cardiovascular:     Rate and Rhythm: Normal rate.  Abdominal:     Tenderness: There is no abdominal tenderness.  Genitourinary:    Comments: No gross blood seen but very little stool on exam. Musculoskeletal:        General: No tenderness.  Skin:    General: Skin is warm.  Neurological:     Mental Status: He is alert and oriented to person, place, and time.     ED Results / Procedures / Treatments   Labs (all labs ordered are listed, but only abnormal results are displayed) Labs Reviewed  COMPREHENSIVE METABOLIC PANEL - Abnormal; Notable for the following components:      Result Value   Glucose, Bld 131 (*)    Creatinine, Ser 1.30 (*)    Total Protein 6.2 (*)    All other components within normal limits  CBC - Abnormal; Notable for the following components:   RBC 4.11 (*)    Hemoglobin 12.3 (*)    HCT 38.5 (*)    All other components within normal limits  DIFFERENTIAL  URINALYSIS, W/ REFLEX TO CULTURE (INFECTION SUSPECTED)  POC OCCULT BLOOD, ED  TYPE AND SCREEN  ABO/RH    EKG None  Radiology No results found.  Procedures Procedures    Medications Ordered in ED Medications - No data to display  ED Course/ Medical Decision Making/ A&P                             Medical Decision Making Amount and/or Complexity of Data Reviewed Labs: ordered.   Patient with reported dark urine and then black stool.  Has been on Flagyl.  On Flagyl for bacterial overgrowth.  Due to have colonoscopy on Friday.  Will get basic blood work including CBC.  Will get urinalysis.  Guaiac done but still pending.  No tenderness really on exam.  Patient with reported dark stool.  Reviewing Flagyl effects and sometimes can cause dark stool or even dark urine.  With the rectal pain feel less likely that it is upper GI bleed.  And if it is vitals reassuring.  If hemoglobin reassuring likely can discharge home with follow-up with GI on Friday.  Hemoglobin reassuring.   Guaiac negative.  Did have enlarged prostate which can be followed as an outpatient.  Appears stable for discharge home.  He has follow-up with GI on Friday.        Final Clinical Impression(s) / ED Diagnoses Final diagnoses:  None    Rx / DC Orders ED Discharge Orders     None  Benjiman Core, MD 01/16/23 2308

## 2023-01-16 NOTE — Discharge Instructions (Signed)
Your hemoglobin was slightly below normal but there was no blood when we checked.  Your prostate all seem to be a little enlarged.  Follow-up with your gastroenterologist this Friday.  Return for lightheadedness or dizziness.

## 2023-01-16 NOTE — ED Notes (Signed)
Patient is being discharged from the Urgent Care and sent to the Emergency Department via POV . Per Salli Quarry NP, patient is in need of higher level of care due to GI issues/ black stools/ abdominal pain. Patient is aware and verbalizes understanding of plan of care.  Vitals:   01/16/23 1540  BP: 139/87  Pulse: 82  Resp: 18  Temp: 99 F (37.2 C)  SpO2: 98%

## 2023-01-16 NOTE — ED Provider Notes (Signed)
Patient presents for evaluation of dark most black, coffee-ground, grainy, stools that began 1 day ago, has had at least  3 occurrences, initially more formed but have softened.  Associated pain with defecation.  Was constipated prior to new symptoms beginning.  Has colonoscopy on 01/19/2023 for SIBO, currently taking Flagyl since Jan 04, 2023, followed by GI.  Due to description of stools, concern for GI bleed and therefore he is being sent to the nearest emergency department for immediate evaluation and workup, vital signs are stable at this time and patient is to escort self   Valinda Hoar, NP 01/16/23 1549

## 2023-01-17 LAB — ABO/RH: ABO/RH(D): B POS

## 2023-01-18 ENCOUNTER — Encounter: Payer: Self-pay | Admitting: Internal Medicine

## 2023-01-19 ENCOUNTER — Ambulatory Visit
Admission: RE | Admit: 2023-01-19 | Discharge: 2023-01-19 | Disposition: A | Discharge status: Home / Self Care | Payer: 59 | Attending: Gastroenterology | Admitting: Gastroenterology

## 2023-01-19 ENCOUNTER — Encounter
Admission: RE | Disposition: A | Discharge status: Home / Self Care | Payer: Self-pay | Source: Home / Self Care | Attending: Gastroenterology

## 2023-01-19 ENCOUNTER — Ambulatory Visit: Payer: 59 | Admitting: Registered Nurse

## 2023-01-19 ENCOUNTER — Encounter: Payer: Self-pay | Admitting: Internal Medicine

## 2023-01-19 DIAGNOSIS — K219 Gastro-esophageal reflux disease without esophagitis: Secondary | ICD-10-CM | POA: Diagnosis not present

## 2023-01-19 DIAGNOSIS — E785 Hyperlipidemia, unspecified: Secondary | ICD-10-CM | POA: Diagnosis not present

## 2023-01-19 DIAGNOSIS — Z1211 Encounter for screening for malignant neoplasm of colon: Secondary | ICD-10-CM | POA: Insufficient documentation

## 2023-01-19 DIAGNOSIS — K640 First degree hemorrhoids: Secondary | ICD-10-CM | POA: Diagnosis not present

## 2023-01-19 DIAGNOSIS — Z6835 Body mass index (BMI) 35.0-35.9, adult: Secondary | ICD-10-CM | POA: Insufficient documentation

## 2023-01-19 DIAGNOSIS — F32A Depression, unspecified: Secondary | ICD-10-CM | POA: Insufficient documentation

## 2023-01-19 DIAGNOSIS — F419 Anxiety disorder, unspecified: Secondary | ICD-10-CM | POA: Insufficient documentation

## 2023-01-19 DIAGNOSIS — E119 Type 2 diabetes mellitus without complications: Secondary | ICD-10-CM | POA: Insufficient documentation

## 2023-01-19 DIAGNOSIS — K635 Polyp of colon: Secondary | ICD-10-CM | POA: Insufficient documentation

## 2023-01-19 DIAGNOSIS — I10 Essential (primary) hypertension: Secondary | ICD-10-CM | POA: Diagnosis not present

## 2023-01-19 DIAGNOSIS — G47419 Narcolepsy without cataplexy: Secondary | ICD-10-CM | POA: Diagnosis not present

## 2023-01-19 DIAGNOSIS — G4730 Sleep apnea, unspecified: Secondary | ICD-10-CM | POA: Insufficient documentation

## 2023-01-19 DIAGNOSIS — K2950 Unspecified chronic gastritis without bleeding: Secondary | ICD-10-CM | POA: Insufficient documentation

## 2023-01-19 DIAGNOSIS — E669 Obesity, unspecified: Secondary | ICD-10-CM | POA: Diagnosis not present

## 2023-01-19 HISTORY — PX: ESOPHAGOGASTRODUODENOSCOPY (EGD) WITH PROPOFOL: SHX5813

## 2023-01-19 HISTORY — DX: Allergic rhinitis, unspecified: J30.9

## 2023-01-19 HISTORY — DX: Gastro-esophageal reflux disease without esophagitis: K21.9

## 2023-01-19 HISTORY — DX: Anxiety disorder, unspecified: F41.9

## 2023-01-19 HISTORY — DX: Obesity, unspecified: E66.9

## 2023-01-19 HISTORY — DX: Depression, unspecified: F32.A

## 2023-01-19 HISTORY — PX: COLONOSCOPY WITH PROPOFOL: SHX5780

## 2023-01-19 HISTORY — DX: Dyspnea, unspecified: R06.00

## 2023-01-19 LAB — GLUCOSE, CAPILLARY: Glucose-Capillary: 129 mg/dL — ABNORMAL HIGH (ref 70–99)

## 2023-01-19 SURGERY — COLONOSCOPY WITH PROPOFOL
Anesthesia: General

## 2023-01-19 MED ORDER — LIDOCAINE HCL (CARDIAC) PF 100 MG/5ML IV SOSY
PREFILLED_SYRINGE | INTRAVENOUS | Status: DC | PRN
  Administered 2023-01-19: 60 mg via INTRAVENOUS

## 2023-01-19 MED ORDER — SODIUM CHLORIDE 0.9 % IV SOLN: INTRAVENOUS | Status: DC

## 2023-01-19 MED ORDER — PROPOFOL 10 MG/ML IV BOLUS
INTRAVENOUS | Status: DC | PRN
  Administered 2023-01-19: 100 mg via INTRAVENOUS

## 2023-01-19 MED ORDER — PROPOFOL 500 MG/50ML IV EMUL
INTRAVENOUS | Status: DC | PRN
  Administered 2023-01-19: 150 ug/kg/min via INTRAVENOUS

## 2023-01-19 NOTE — Op Note (Signed)
[facility] Patient Name: Timothy Friedman Procedure Date: 01/19/2023 12:11 PM MRN: [id number] Account #: [id number] Date of Birth: [birth year] Admit Type: Outpatient Age: 52 Room: [facility] ENDO ROOM 3 Gender: Male Note Status: Finalized Instrument Name: Laurette Schimke [id number] Procedure:             Upper GI endoscopy Indications:           Dyspepsia Providers:             Eather Colas MD, MD Referring MD:          Boykin Nearing. Norma Fredrickson MD, MD (Referring MD) Medicines:             Monitored Anesthesia Care Complications:         No immediate complications. Estimated blood loss:                         Minimal. Procedure:             Pre-Anesthesia Assessment:                        - Prior to the procedure, a History and Physical was                         performed, and patient medications and allergies were                         reviewed. The patient is competent. The risks and                         benefits of the procedure and the sedation options and                         risks were discussed with the patient. All questions                         were answered and informed consent was obtained.                         Patient identification and proposed procedure were                         verified by the physician, the nurse, the                         anesthesiologist, the anesthetist and the technician                         in the endoscopy suite. Mental Status Examination:                         alert and oriented. Airway Examination: normal                         oropharyngeal airway and neck mobility. Respiratory                         Examination: clear to auscultation. CV Examination:  normal. Prophylactic Antibiotics: The patient does not                         require prophylactic antibiotics. Prior                         Anticoagulants: The patient has taken no anticoagulant                          or antiplatelet agents. ASA Grade Assessment: II - A                         patient with mild systemic disease. After reviewing                         the risks and benefits, the patient was deemed in                         satisfactory condition to undergo the procedure. The                         anesthesia plan was to use monitored anesthesia care                         (MAC). Immediately prior to administration of                         medications, the patient was re-assessed for adequacy                         to receive sedatives. The heart rate, respiratory                         rate, oxygen saturations, blood pressure, adequacy of                         pulmonary ventilation, and response to care were                         monitored throughout the procedure. The physical                         status of the patient was re-assessed after the                         procedure.                        After obtaining informed consent, the endoscope was                         passed under direct vision. Throughout the procedure,                         the patient's blood pressure, pulse, and oxygen                         saturations were monitored continuously. The Endoscope  was introduced through the mouth, and advanced to the                         second part of duodenum. The upper GI endoscopy was                         accomplished without difficulty. The patient tolerated                         the procedure well. Findings:      The examined esophagus was normal.      Patchy mild inflammation characterized by erythema was found in the       gastric antrum. Biopsies were taken with a cold forceps for Helicobacter       pylori testing. Estimated blood loss was minimal.      The examined duodenum was normal. Impression:            - Normal esophagus.                        - Gastritis. Biopsied.                        - Normal examined  duodenum. Recommendation:        - Discharge patient to home.                        - Resume previous diet.                        - Continue present medications.                        - Await pathology results.                        - Return to referring physician as previously                         scheduled. Procedure Code(s):     --- Professional ---                        [phone], Esophagogastroduodenoscopy, flexible,                         transoral; with biopsy, single or multiple Diagnosis Code(s):     --- Professional ---                        K29.70, Gastritis, unspecified, without bleeding                        R10.13, Epigastric pain CPT copyright 2022 [employer]. All rights reserved. The codes documented in this report are preliminary and upon coder review may  be revised to meet current compliance requirements. Eather Colas MD, MD 01/19/2023 12:49:29 PM Number of Addenda: 0 Note Initiated On: 01/19/2023 12:11 PM Estimated Blood Loss:  Estimated blood loss was minimal.      [facility]

## 2023-01-19 NOTE — Transfer of Care (Signed)
Immediate Anesthesia Transfer of Care Note  Patient: Timothy Friedman  Procedure(s) Performed: COLONOSCOPY WITH PROPOFOL ESOPHAGOGASTRODUODENOSCOPY (EGD) WITH PROPOFOL  Patient Location: Endoscopy Unit  Anesthesia Type:General  Level of Consciousness: drowsy  Airway & Oxygen Therapy: Patient Spontanous Breathing  Post-op Assessment: Report given to RN and Post -op Vital signs reviewed and stable  Post vital signs: Reviewed and stable  Last Vitals:  Vitals Value Taken Time  BP 95/67 01/19/23 1250  Temp    Pulse 74 01/19/23 1250  Resp 21 01/19/23 1250  SpO2 98 % 01/19/23 1250    Last Pain:  Vitals:   01/19/23 1248  TempSrc:   PainSc: Asleep         Complications: No notable events documented.

## 2023-01-19 NOTE — H&P (Signed)
Outpatient short stay form Pre-procedure 01/19/2023  Timothy Bill, MD  Primary Physician: Rayetta Humphrey, MD  Reason for visit:  Dyspepsia/Colon cancer screening  History of present illness:    52 y/o gentleman with history of DM II, hypertension, and obesity here for EGD for dyspepsia and colonoscopy for index colon cancer screening. No blood thinners. No family history of GI malignancies. No significant abdominal surgeries.    Current Facility-Administered Medications:    0.9 %  sodium chloride infusion, , Intravenous, Continuous, Ebonye Reade, Rossie Muskrat, MD  Medications Prior to Admission  Medication Sig Dispense Refill Last Dose   blood glucose meter kit and supplies KIT by Does not apply route daily as needed. Dispense based on patient and insurance preference. Use up to four times daily as directed.   01/19/2023   ezetimibe (ZETIA) 10 MG tablet Take 10 mg by mouth daily.   01/18/2023   famotidine (PEPCID) 20 MG tablet Take 20 mg by mouth 2 (two) times daily.   Past Week   FLUoxetine (PROZAC) 20 MG capsule Take 20 mg by mouth daily.   01/18/2023   glipiZIDE (GLUCOTROL XL) 10 MG 24 hr tablet Take 2 tablets (20 mg total) by mouth daily before breakfast. 30 tablet 0 Past Week   lubiprostone (AMITIZA) 8 MCG capsule Take 8 mcg by mouth 2 (two) times daily with a meal.   Past Week   rifaximin (XIFAXAN) 550 MG TABS tablet Take 550 mg by mouth. (Patient not taking: Reported on 01/16/2023)      valsartan (DIOVAN) 80 MG tablet Take 80 mg by mouth daily.   01/18/2023   acetaminophen (TYLENOL) 500 MG tablet Take 500-1,000 mg by mouth every 6 (six) hours as needed (for headaches or pain).     at prn   hydrochlorothiazide (HYDRODIURIL) 25 MG tablet Take 25 mg by mouth daily.  3    HYDROcodone-acetaminophen (NORCO/VICODIN) 5-325 MG per tablet Take 1-2 tablets by mouth every 6 (six) hours as needed. (Patient not taking: Reported on 01/16/2023) 10 tablet 0    Linaclotide (LINZESS) 145 MCG CAPS Take 1 capsule  (145 mcg total) by mouth daily. (Patient not taking: Reported on 01/16/2023) 30 capsule 3    lisinopril (PRINIVIL,ZESTRIL) 5 MG tablet Take 1 tablet (5 mg total) by mouth daily. (Patient not taking: Reported on 11/26/2018) 90 tablet 4    metFORMIN (GLUCOPHAGE-XR) 500 MG 24 hr tablet Take 1 tablet (500 mg total) by mouth 2 (two) times daily with a meal. (Patient taking differently: Take 2,000 mg by mouth daily with breakfast. ) 60 tablet 0    pioglitazone (ACTOS) 15 MG tablet Take 1 tablet (15 mg total) by mouth daily. 30 tablet 0    simvastatin (ZOCOR) 20 MG tablet Take 20 mg by mouth at bedtime.      sitaGLIPtin (JANUVIA) 100 MG tablet Take 100 mg by mouth daily. (Patient not taking: Reported on 01/16/2023)        Allergies  Allergen Reactions   Iodinated Contrast Media Anaphylaxis   Iodine Anaphylaxis   Shellfish Allergy Anaphylaxis and Swelling    Swelling of the throat   Linzess [Linaclotide] Diarrhea and Other (See Comments)    Caused very bad diarrhea that would occur unexpectedly     Past Medical History:  Diagnosis Date   Allergic rhinitis    Anxiety    Constipation    Depression    Diabetes mellitus    Dyspnea    GERD (gastroesophageal reflux disease)  Hyperlipidemia    Hypertension    Narcolepsy    Obesity    Premature ejaculation    Sleep apnea    Small intestinal bacterial overgrowth (SIBO)     Review of systems:  Otherwise negative.    Physical Exam  Gen: Alert, oriented. Appears stated age.  HEENT: PERRLA. Lungs: No respiratory distress CV: RRR Abd: soft, benign, no masses Ext: No edema    Planned procedures: Proceed with EGD/colonoscopy. The patient understands the nature of the planned procedure, indications, risks, alternatives and potential complications including but not limited to bleeding, infection, perforation, damage to internal organs and possible oversedation/side effects from anesthesia. The patient agrees and gives consent to proceed.   Please refer to procedure notes for findings, recommendations and patient disposition/instructions.     Timothy Bill, MD [facility]

## 2023-01-19 NOTE — Anesthesia Preprocedure Evaluation (Addendum)
Anesthesia Evaluation  Patient identified by MRN, date of birth, ID band Patient awake    Reviewed: Allergy & Precautions, NPO status , Patient's Chart, lab work & pertinent test results  History of Anesthesia Complications Negative for: history of anesthetic complications  Airway Mallampati: III   Neck ROM: Full    Dental  (+) Missing   Pulmonary sleep apnea    Pulmonary exam normal breath sounds clear to auscultation       Cardiovascular hypertension, Normal cardiovascular exam Rhythm:Regular Rate:Normal     Neuro/Psych  Headaches PSYCHIATRIC DISORDERS Anxiety Depression    Narcolepsy     GI/Hepatic ,GERD  ,,  Endo/Other  diabetes, Type 2  Obesity  Renal/GU      Musculoskeletal   Abdominal   Peds  Hematology negative hematology ROS (+)   Anesthesia Other Findings   Reproductive/Obstetrics                             Anesthesia Physical Anesthesia Plan  ASA: 2  Anesthesia Plan: General   Post-op Pain Management:    Induction: Intravenous  PONV Risk Score and Plan: 2 and Propofol infusion, TIVA and Treatment may vary due to age or medical condition  Airway Management Planned: Natural Airway  Additional Equipment:   Intra-op Plan:   Post-operative Plan:   Informed Consent: I have reviewed the patients History and Physical, chart, labs and discussed the procedure including the risks, benefits and alternatives for the proposed anesthesia with the patient or authorized representative who has indicated his/her understanding and acceptance.       Plan Discussed with: CRNA  Anesthesia Plan Comments: (LMA/GETA backup discussed.  Patient consented for risks of anesthesia including but not limited to:  - adverse reactions to medications - damage to eyes, teeth, lips or other oral mucosa - nerve damage due to positioning  - sore throat or hoarseness - damage to heart, brain,  nerves, lungs, other parts of body or loss of life  Informed patient about role of CRNA in peri- and intra-operative care.  Patient voiced understanding.)        Anesthesia Quick Evaluation

## 2023-01-19 NOTE — Op Note (Signed)
[facility] Gastroenterology Patient Name: Timothy Friedman Procedure Date: 01/19/2023 12:11 PM MRN: [id number] Account #: [id number] Date of Birth: 28-Jun-1971 Admit Type: Outpatient Age: 52 Room: [facility] ENDO ROOM 3 Gender: Male Note Status: Finalized Instrument Name: Prentice Docker [id number] Procedure:             Colonoscopy Indications:           Screening for colorectal malignant neoplasm Providers:             Eather Colas MD, MD Referring MD:          Boykin Nearing. Norma Fredrickson MD, MD (Referring MD) Medicines:             Monitored Anesthesia Care Complications:         No immediate complications. Estimated blood loss:                         Minimal. Procedure:             Pre-Anesthesia Assessment:                        - Prior to the procedure, a History and Physical was                         performed, and patient medications and allergies were                         reviewed. The patient is competent. The risks and                         benefits of the procedure and the sedation options and                         risks were discussed with the patient. All questions                         were answered and informed consent was obtained.                         Patient identification and proposed procedure were                         verified by the physician, the nurse, the                         anesthesiologist, the anesthetist and the technician                         in the endoscopy suite. Mental Status Examination:                         alert and oriented. Airway Examination: normal                         oropharyngeal airway and neck mobility. Respiratory                         Examination: clear to auscultation. CV Examination:  normal. Prophylactic Antibiotics: The patient does not                         require prophylactic antibiotics. Prior                         Anticoagulants: The patient has taken no anticoagulant                          or antiplatelet agents. ASA Grade Assessment: II - A                         patient with mild systemic disease. After reviewing                         the risks and benefits, the patient was deemed in                         satisfactory condition to undergo the procedure. The                         anesthesia plan was to use monitored anesthesia care                         (MAC). Immediately prior to administration of                         medications, the patient was re-assessed for adequacy                         to receive sedatives. The heart rate, respiratory                         rate, oxygen saturations, blood pressure, adequacy of                         pulmonary ventilation, and response to care were                         monitored throughout the procedure. The physical                         status of the patient was re-assessed after the                         procedure.                        After obtaining informed consent, the colonoscope was                         passed under direct vision. Throughout the procedure,                         the patient's blood pressure, pulse, and oxygen                         saturations were monitored continuously. The  Colonoscope was introduced through the anus and                         advanced to the the cecum, identified by appendiceal                         orifice and ileocecal valve. The colonoscopy was                         performed without difficulty. The patient tolerated                         the procedure well. The quality of the bowel                         preparation was good. The ileocecal valve, appendiceal                         orifice, and rectum were photographed. Findings:      The perianal and digital rectal examinations were normal.      A 1 mm polyp was found in the recto-sigmoid colon. The polyp was       sessile. The polyp was removed  with a jumbo cold forceps. Resection and       retrieval were complete. Estimated blood loss was minimal.      Internal hemorrhoids were found during retroflexion. The hemorrhoids       were Grade I (internal hemorrhoids that do not prolapse).      The exam was otherwise without abnormality on direct and retroflexion       views. Impression:            - One 1 mm polyp at the recto-sigmoid colon, removed                         with a jumbo cold forceps. Resected and retrieved.                        - Internal hemorrhoids.                        - The examination was otherwise normal on direct and                         retroflexion views. Recommendation:        - Discharge patient to home.                        - Resume previous diet.                        - Continue present medications.                        - Await pathology results.                        - Repeat colonoscopy for surveillance based on                         pathology results.                        -  Return to referring physician as previously                         scheduled. Procedure Code(s):     --- Professional ---                        [phone], Colonoscopy, flexible; with biopsy, single or                         multiple Diagnosis Code(s):     --- Professional ---                        Z12.11, Encounter for screening for malignant neoplasm                         of colon                        D12.7, Benign neoplasm of rectosigmoid junction                        K64.0, First degree hemorrhoids CPT copyright 2022 [employer]. All rights reserved. The codes documented in this report are preliminary and upon coder review may  be revised to meet current compliance requirements. Eather Colas MD, MD 01/19/2023 12:57:20 PM Number of Addenda: 0 Note Initiated On: 01/19/2023 12:11 PM Scope Withdrawal Time: 0 hours 11 minutes 31 seconds  Total Procedure Duration: 0 hours 18 minutes 59  seconds  Estimated Blood Loss:  Estimated blood loss was minimal.      [facility]

## 2023-01-19 NOTE — Interval H&P Note (Signed)
History and Physical Interval Note:  01/19/2023 12:14 PM  Timothy Friedman  has presented today for surgery, with the diagnosis of screening dysphagia.  The various methods of treatment have been discussed with the patient and family. After consideration of risks, benefits and other options for treatment, the patient has consented to  Procedure(s): COLONOSCOPY WITH PROPOFOL (N/A) ESOPHAGOGASTRODUODENOSCOPY (EGD) WITH PROPOFOL (N/A) as a surgical intervention.  The patient's history has been reviewed, patient examined, no change in status, stable for surgery.  I have reviewed the patient's chart and labs.  Questions were answered to the patient's satisfaction.     Regis Bill  Ok to proceed with EGD/Colonoscopy

## 2023-01-19 NOTE — Anesthesia Postprocedure Evaluation (Signed)
Anesthesia Post Note  Patient: FROILAN MCLEAN  Procedure(s) Performed: COLONOSCOPY WITH PROPOFOL ESOPHAGOGASTRODUODENOSCOPY (EGD) WITH PROPOFOL  Patient location during evaluation: PACU Anesthesia Type: General Level of consciousness: awake and alert, oriented and patient cooperative Pain management: pain level controlled Vital Signs Assessment: post-procedure vital signs reviewed and stable Respiratory status: spontaneous breathing, nonlabored ventilation and respiratory function stable Cardiovascular status: blood pressure returned to baseline and stable Postop Assessment: adequate PO intake Anesthetic complications: no   No notable events documented.   Last Vitals:  Vitals:   01/19/23 1250 01/19/23 1259  BP: 95/67 111/77  Pulse: 74 72  Resp: (!) 21 (!) 21  Temp:    SpO2: 98% 100%    Last Pain:  Vitals:   01/19/23 1259  TempSrc:   PainSc: 0-No pain                 Reed Breech

## 2023-01-22 ENCOUNTER — Encounter: Payer: Self-pay | Admitting: Gastroenterology

## 2023-02-05 ENCOUNTER — Other Ambulatory Visit: Payer: Self-pay | Admitting: Gastroenterology

## 2023-02-05 DIAGNOSIS — R1013 Epigastric pain: Secondary | ICD-10-CM

## 2023-02-05 DIAGNOSIS — R1084 Generalized abdominal pain: Secondary | ICD-10-CM

## 2023-02-09 ENCOUNTER — Other Ambulatory Visit: Payer: 59

## 2023-02-12 ENCOUNTER — Ambulatory Visit
Admission: RE | Admit: 2023-02-12 | Discharge: 2023-02-12 | Disposition: A | Discharge status: Home / Self Care | Payer: 59 | Source: Ambulatory Visit | Attending: Gastroenterology | Admitting: Gastroenterology

## 2023-02-12 DIAGNOSIS — R1013 Epigastric pain: Secondary | ICD-10-CM

## 2023-02-12 DIAGNOSIS — R1084 Generalized abdominal pain: Secondary | ICD-10-CM

## 2024-08-31 ENCOUNTER — Encounter (HOSPITAL_COMMUNITY): Payer: Self-pay

## 2024-08-31 ENCOUNTER — Emergency Department (HOSPITAL_COMMUNITY)
Admission: EM | Admit: 2024-08-31 | Discharge: 2024-08-31 | Disposition: A | Discharge status: Home / Self Care | Attending: Emergency Medicine | Admitting: Emergency Medicine

## 2024-08-31 ENCOUNTER — Emergency Department (HOSPITAL_COMMUNITY)

## 2024-08-31 ENCOUNTER — Other Ambulatory Visit: Payer: Self-pay

## 2024-08-31 DIAGNOSIS — S20222A Contusion of left back wall of thorax, initial encounter: Secondary | ICD-10-CM | POA: Diagnosis not present

## 2024-08-31 DIAGNOSIS — W11XXXA Fall on and from ladder, initial encounter: Secondary | ICD-10-CM | POA: Insufficient documentation

## 2024-08-31 DIAGNOSIS — S3992XA Unspecified injury of lower back, initial encounter: Secondary | ICD-10-CM | POA: Diagnosis present

## 2024-08-31 MED ORDER — METHOCARBAMOL 500 MG PO TABS: 500.0000 mg | ORAL_TABLET | Freq: Three times a day (TID) | ORAL | 0 refills | Status: AC | PRN

## 2024-08-31 MED ORDER — IBUPROFEN 800 MG PO TABS: 800.0000 mg | ORAL_TABLET | Freq: Four times a day (QID) | ORAL | 0 refills | Status: AC | PRN

## 2024-08-31 NOTE — ED Triage Notes (Signed)
 Clemens off of a ladder from the 4th rung. Hit the left side on a desk. Hurting in the left shoulder, pectoris, has a bruise over the scapula and to the middle of his back. No discomfort breathing.

## 2024-08-31 NOTE — ED Notes (Signed)
 Patient returned from X-ray

## 2024-08-31 NOTE — ED Provider Notes (Signed)
 " [place] [facility] AT [facility] Provider Note   CSN: [id number] Arrival date & time: 08/31/24  2223     Patient presents with: Timothy Friedman is a 54 y.o. male.   Presents to the emergency department for evaluation after a fall.  Patient reports that he was coming down a ladder when he slipped and fell from about the fourth rung.  Patient hit the left side of his upper back on the corner of a desk when he fell.  He is complaining of pain in the left upper back and with movement of the left arm.  Did not hit his head or lose consciousness.  No cervical spine pain or tenderness, midline back pain.  Patient denies any other extremity injury, no abdominal pain, chest pain or trouble breathing.       Prior to Admission medications  Medication Sig Start Date End Date Taking? Authorizing Provider  ibuprofen  (ADVIL ) 800 MG tablet Take 1 tablet (800 mg total) by mouth every 6 (six) hours as needed for moderate pain (pain score 4-6). 08/31/24  Yes Malu Pellegrini, Lonni PARAS, MD  methocarbamol  (ROBAXIN ) 500 MG tablet Take 1 tablet (500 mg total) by mouth every 8 (eight) hours as needed for muscle spasms. 08/31/24  Yes Dandrea Medders, Lonni PARAS, MD  acetaminophen  (TYLENOL ) 500 MG tablet Take 500-1,000 mg by mouth every 6 (six) hours as needed (for headaches or pain).     [provider]  blood glucose meter kit and supplies KIT by Does not apply route daily as needed. Dispense based on patient and insurance preference. Use up to four times daily as directed.    [provider]  ezetimibe (ZETIA) 10 MG tablet Take 10 mg by mouth daily.    [provider]  famotidine (PEPCID) 20 MG tablet Take 20 mg by mouth 2 (two) times daily.    [provider]  FLUoxetine  (PROZAC ) 20 MG capsule Take 20 mg by mouth daily.    [provider]  glipiZIDE  (GLUCOTROL  XL) 10 MG 24 hr tablet Take 2 tablets (20 mg total) by mouth daily before breakfast.  01/09/18   Keith Sor, PA-C  hydrochlorothiazide  (HYDRODIURIL ) 25 MG tablet Take 25 mg by mouth daily. 12/24/17   [provider]  HYDROcodone -acetaminophen  (NORCO/VICODIN) 5-325 MG per tablet Take 1-2 tablets by mouth every 6 (six) hours as needed. Patient not taking: Reported on 01/16/2023 11/17/14   Vicky Charleston, PA-C  Linaclotide  (LINZESS ) 145 MCG CAPS Take 1 capsule (145 mcg total) by mouth daily. Patient not taking: Reported on 01/16/2023 12/11/12   Vannie Delon LABOR, MD  lisinopril  (PRINIVIL ,ZESTRIL ) 5 MG tablet Take 1 tablet (5 mg total) by mouth daily. Patient not taking: Reported on 11/26/2018 03/16/14 11/26/18  Vannie Delon LABOR, MD  lubiprostone  (AMITIZA ) 8 MCG capsule Take 8 mcg by mouth 2 (two) times daily with a meal.    [provider]  metFORMIN  (GLUCOPHAGE -XR) 500 MG 24 hr tablet Take 1 tablet (500 mg total) by mouth 2 (two) times daily with a meal. Patient taking differently: Take 2,000 mg by mouth daily with breakfast.  09/12/18   Dicky Anes, MD  pioglitazone  (ACTOS ) 15 MG tablet Take 1 tablet (15 mg total) by mouth daily. 09/12/18 09/12/19  Dicky Anes, MD  rifaximin (XIFAXAN) 550 MG TABS tablet Take 550 mg by mouth. Patient not taking: Reported on 01/16/2023    [provider]  simvastatin (ZOCOR) 20 MG tablet Take 20 mg by mouth  at bedtime. 01/18/18 01/18/19  [provider]  sitaGLIPtin (JANUVIA) 100 MG tablet Take 100 mg by mouth daily. Patient not taking: Reported on 01/16/2023    [provider]  valsartan (DIOVAN) 80 MG tablet Take 80 mg by mouth daily.    [provider]    Allergies: Iodinated contrast media, Iodine, Shellfish allergy, and Linzess  [linaclotide ]    Review of Systems  Updated Vital Signs BP (!) 161/84 (BP Location: Right Arm)   Pulse 97   Temp 98.4 F (36.9 C) (Oral)   Resp 19   Ht 5' 6 (1.676 m)   Wt 97.5 kg   SpO2 99%   BMI 34.70 kg/m   Physical Exam Vitals and nursing note reviewed.   Constitutional:      General: He is not in acute distress.    Appearance: He is well-developed.  HENT:     Head: Normocephalic and atraumatic.     Mouth/Throat:     Mouth: Mucous membranes are moist.  Eyes:     General: Vision grossly intact. Gaze aligned appropriately.     Extraocular Movements: Extraocular movements intact.     Conjunctiva/sclera: Conjunctivae normal.  Cardiovascular:     Rate and Rhythm: Normal rate and regular rhythm.     Pulses: Normal pulses.     Heart sounds: Normal heart sounds, S1 normal and S2 normal. No murmur heard.    No friction rub. No gallop.  Pulmonary:     Effort: Pulmonary effort is normal. No respiratory distress.     Breath sounds: Normal breath sounds.  Abdominal:     Palpations: Abdomen is soft.     Tenderness: There is no abdominal tenderness. There is no guarding or rebound.     Hernia: No hernia is present.  Musculoskeletal:        General: No swelling.     Left shoulder: No deformity. Normal range of motion.     Cervical back: Full passive range of motion without pain, normal range of motion and neck supple. No pain with movement, spinous process tenderness or muscular tenderness. Normal range of motion.     Thoracic back: No tenderness.     Lumbar back: No tenderness.       Back:     Right lower leg: No edema.     Left lower leg: No edema.  Skin:    General: Skin is warm and dry.     Capillary Refill: Capillary refill takes less than 2 seconds.     Findings: No ecchymosis, erythema, lesion or wound.  Neurological:     Mental Status: He is alert and oriented to person, place, and time.     GCS: GCS eye subscore is 4. GCS verbal subscore is 5. GCS motor subscore is 6.     Cranial Nerves: Cranial nerves 2-12 are intact.     Sensory: Sensation is intact.     Motor: Motor function is intact. No weakness or abnormal muscle tone.     Coordination: Coordination is intact.  Psychiatric:        Mood and Affect: Mood normal.         Speech: Speech normal.        Behavior: Behavior normal.     (all labs ordered are listed, but only abnormal results are displayed) Labs Reviewed - No data to display  EKG: None  Radiology: DG Shoulder Left Result Date: 08/31/2024 EXAM: 1 VIEW(S) XRAY OF THE LEFT SHOULDER 08/31/2024 11:23:00 PM COMPARISON: 11/17/2014  CLINICAL HISTORY: recent fall with left shoulder pain FINDINGS: BONES AND JOINTS: Glenohumeral joint is normally aligned. No acute fracture. No malalignment. The [facility] joint is unremarkable. SOFT TISSUES: No abnormal calcifications. Visualized lung is unremarkable. IMPRESSION: 1. No evidence of acute traumatic injury. Electronically signed by: Oneil Devonshire MD 08/31/2024 11:28 PM EST RP Workstation: [id number]   DG Ribs Unilateral W/Chest Left Result Date: 08/31/2024 EXAM: 1 VIEW(S) XRAY OF THE LEFT RIBS AND CHEST 08/31/2024 11:23:00 PM COMPARISON: 09/12/2018 CLINICAL HISTORY: Recent fall. FINDINGS: BONES: No acute displaced rib fracture. LUNGS AND PLEURA: No consolidation or pulmonary edema. No pleural effusion or pneumothorax. HEART AND MEDIASTINUM: No acute abnormality of the cardiac and mediastinal silhouettes. IMPRESSION: 1. No acute rib fracture. Electronically signed by: Oneil Devonshire MD 08/31/2024 11:27 PM EST RP Workstation: [id number]     Procedures   Medications Ordered in the ED - No data to display                                  Medical Decision Making Amount and/or Complexity of Data Reviewed Radiology: ordered and independent interpretation performed. Decision-making details documented in ED Course.   Differential diagnosis considered includes, but not limited to: Blunt trauma including intracranial injury, spinal injury, thoracic injury, intra-abdominal and retroperitoneal injury, orthopedic injury  Presents to the emergency department for evaluation of injury after a fall.  Examination reveals a linear abrasion across the left upper chest and scapular area  from where he hit the corner of a desk.  No midline tenderness to suggest thoracic or lumbar injury.  Cervical spine cleared by Nexus criteria.  No head injury by history.  Patient without crepitus on exam.  Lungs are clear bilaterally.  Chest x-ray, left rib x-ray, left shoulder x-ray negative.     Final diagnoses:  Contusion of left back wall of thorax, initial encounter    ED Discharge Orders          Ordered    ibuprofen  (ADVIL ) 800 MG tablet  Every 6 hours PRN        08/31/24 2332    methocarbamol  (ROBAXIN ) 500 MG tablet  Every 8 hours PRN        08/31/24 2332               Haze Lonni PARAS, MD 08/31/24 2333  "

## 2024-08-31 NOTE — ED Notes (Signed)
 Patient transported to X-ray
